# Patient Record
Sex: Female | Born: 1990 | Race: White | Marital: Married | Smoking: Never smoker
Health system: Southern US, Community
[De-identification: ages and names within clinical notes are randomized; demographics above are authoritative.]

## PROBLEM LIST (undated history)

## (undated) HISTORY — PX: TONSILLECTOMY: SUR1361

---

## 2018-01-17 ENCOUNTER — Ambulatory Visit (INDEPENDENT_AMBULATORY_CARE_PROVIDER_SITE_OTHER): Payer: PRIVATE HEALTH INSURANCE | Admitting: Obstetrics & Gynecology

## 2018-01-17 ENCOUNTER — Encounter: Payer: Self-pay | Admitting: Obstetrics & Gynecology

## 2018-01-17 VITALS — BP 119/70 | HR 84 | Ht 66.0 in | Wt 156.1 lb

## 2018-01-17 DIAGNOSIS — O021 Missed abortion: Secondary | ICD-10-CM | POA: Diagnosis not present

## 2018-01-17 DIAGNOSIS — Z3400 Encounter for supervision of normal first pregnancy, unspecified trimester: Secondary | ICD-10-CM | POA: Insufficient documentation

## 2018-01-17 MED ORDER — IBUPROFEN 800 MG PO TABS: 800.0000 mg | ORAL_TABLET | Freq: Three times a day (TID) | ORAL | 0 refills | Status: DC | PRN

## 2018-01-17 MED ORDER — MISOPROSTOL 200 MCG PO TABS: ORAL_TABLET | ORAL | 0 refills | Status: DC

## 2018-01-17 NOTE — Patient Instructions (Signed)

## 2018-01-17 NOTE — Progress Notes (Signed)
History:  27 y.o. G1P0 here today for NOB visit. Sure LMP places pt at 11 5/7 weeks.  She reports that 4 weeks prev her nausea and breast tenderness began to resolve.  Pt denies bleeding or pelvic pain.     The following portions of the patient's history were reviewed and updated as appropriate: allergies, current medications, past family history, past medical history, past social history, past surgical history and problem list.  Review of Systems:  Pertinent items are noted in HPI.    Objective:  Physical Exam Blood pressure 119/70, pulse 84, height 5\' 6"  (1.676 m), weight 156 lb 1.6 oz (70.8 kg), last menstrual period 10/28/2017.  CONSTITUTIONAL: Well-developed, well-nourished female in no acute distress.  HENT:  Normocephalic, atraumatic EYES: Conjunctivae and EOM are normal. No scleral icterus.  NECK: Normal range of motion SKIN: Skin is warm and dry. No rash noted. Not diaphoretic.No pallor. NEUROLGIC: Alert and oriented to person, place, and time. Normal coordination.  Abd: Soft, nontender and nondistended Pelvic: Normal appearing external genitalia.  Labs and Imaging TV US: 8 week IUP; no FHR  Assessment & Plan:  1. Missed abortion - misoprostol (CYTOTEC) 200 MCG tablet; Place four tablets intravaginally x 1. Repeat in 8-12 hours if no passage of tissue.  Dispense: 8 tablet; Refill: 0 - ibuprofen (ADVIL,MOTRIN) 800 MG tablet; Take 1 tablet (800 mg total) by mouth 3 (three) times daily with meals as needed for headache or moderate pain.  Dispense: 20 tablet; Refill: 0  Reviewed missed ab and tx options. Pt elects conservative management with cytotec. I have reviewed the procedure and the potential complications assoc with it and possible times of need for hosp f/u. All questions answered of pt, mother and her husband.      Total face-to-face time with patient was 25 min.  Greater than 50% was spent in counseling and coordination of care with the patient.   Modestine Scherzinger L.  Harraway-Smith, M.D., Evern CoreFACOG

## 2018-01-17 NOTE — Progress Notes (Signed)
DATING AND VIABILITY SONOGRAM   Liliane BadeKaela Zolman is a 27 y.o. year old G1P0 with LMP Patient's last menstrual period was 10/28/2017 (exact date). which would correlate to  369w4d weeks gestation.  She has regular menstrual cycles.   She is here today for a confirmatory initial sonogram.    GESTATION: SINGLETON  FETAL ACTIVITY:          Heart rate      none          The fetus is inactive.   ADNEXA: The ovaries are normal.   GESTATIONAL AGE AND  BIOMETRICS:  Gestational criteria: Estimated Date of Delivery: 08/04/18 by LMP now at 649w4d  Previous Scans:0  GESTATIONAL SAC           3.58 cm         8-5weeks  CROWN RUMP LENGTH           1.93 cm         8-3 weeks                                                                               AVERAGE EGA(BY THIS SCAN): 8-4weeks        TECHNICIAN COMMENTS:  Dr. Erin FullingHarraway Smith also performed bedside transvaginal ultrasound and did not see any fetal cardiac activity.    Armandina StammerJennifer Howard 01/17/2018 9:34 AM   Eber Jonesarolyn L. Harraway-Smith, M.D., Evern CoreFACOG

## 2018-01-31 ENCOUNTER — Encounter: Payer: Self-pay | Admitting: Family Medicine

## 2018-01-31 ENCOUNTER — Ambulatory Visit (INDEPENDENT_AMBULATORY_CARE_PROVIDER_SITE_OTHER): Payer: PRIVATE HEALTH INSURANCE | Admitting: Family Medicine

## 2018-01-31 VITALS — BP 127/85 | HR 98 | Ht 66.0 in | Wt 157.0 lb

## 2018-01-31 DIAGNOSIS — O021 Missed abortion: Secondary | ICD-10-CM | POA: Diagnosis not present

## 2018-01-31 NOTE — Progress Notes (Signed)
   Subjective:    Patient ID: Elizabeth Singleton, female    DOB: 01-28-1991, 27 y.o.   MRN: [id number]  HPI Seen for f/u of MAB. Had cytotec and passed tissue. No cramping and bleeding as of a 2-3 days ago.    Review of Systems     Objective:   Physical Exam  Constitutional: She is oriented to person, place, and time. She appears well-developed and well-nourished.  Cardiovascular: Normal rate and regular rhythm.  Pulmonary/Chest: Effort normal and breath sounds normal.  Abdominal: Soft. Bowel sounds are normal. She exhibits no mass. There is no tenderness. There is no rebound and no guarding.  Neurological: She is alert and oriented to person, place, and time.  Skin: Skin is warm and dry.  Psychiatric: She has a normal mood and affect. Her behavior is normal.      Assessment & Plan:  1. Missed abortion Continue PNV. Okay to resume activity. Discussed waiting 3 months prior to trying to get pregnant.

## 2019-07-14 NOTE — L&D Delivery Note (Signed)
Delivery Note At 4:24 PM a viable female was delivered via Vaginal, Spontaneous (Presentation:   Occiput Anterior).  APGAR: 7, 9; weight 6 lb 5.2 oz (2870 g).   Placenta status: Spontaneous, Intact.  Cord: 3 vessels with the following complications: loose nuchal x 1  Cord pH: NA  Anesthesia: Epidural Episiotomy: None Lacerations: None Suture Repair: NA Est. Blood Loss (mL): 50  Mom to postpartum.  Baby to NICU.  Lelon Mast 01/20/2020, 4:45 PM

## 2019-08-03 ENCOUNTER — Ambulatory Visit (INDEPENDENT_AMBULATORY_CARE_PROVIDER_SITE_OTHER): Payer: No Typology Code available for payment source | Admitting: Family Medicine

## 2019-08-03 ENCOUNTER — Encounter: Payer: Self-pay | Admitting: Family Medicine

## 2019-08-03 ENCOUNTER — Other Ambulatory Visit: Payer: Self-pay

## 2019-08-03 VITALS — BP 134/72 | HR 94 | Wt 170.0 lb

## 2019-08-03 DIAGNOSIS — Z3A09 9 weeks gestation of pregnancy: Secondary | ICD-10-CM

## 2019-08-03 DIAGNOSIS — Z362 Encounter for other antenatal screening follow-up: Secondary | ICD-10-CM | POA: Diagnosis not present

## 2019-08-03 DIAGNOSIS — Z1151 Encounter for screening for human papillomavirus (HPV): Secondary | ICD-10-CM

## 2019-08-03 DIAGNOSIS — Z124 Encounter for screening for malignant neoplasm of cervix: Secondary | ICD-10-CM | POA: Diagnosis not present

## 2019-08-03 DIAGNOSIS — Z113 Encounter for screening for infections with a predominantly sexual mode of transmission: Secondary | ICD-10-CM | POA: Diagnosis not present

## 2019-08-03 DIAGNOSIS — O0990 Supervision of high risk pregnancy, unspecified, unspecified trimester: Secondary | ICD-10-CM | POA: Diagnosis not present

## 2019-08-03 DIAGNOSIS — Z3401 Encounter for supervision of normal first pregnancy, first trimester: Secondary | ICD-10-CM | POA: Insufficient documentation

## 2019-08-03 DIAGNOSIS — Z3480 Encounter for supervision of other normal pregnancy, unspecified trimester: Secondary | ICD-10-CM

## 2019-08-03 DIAGNOSIS — Z3481 Encounter for supervision of other normal pregnancy, first trimester: Secondary | ICD-10-CM

## 2019-08-03 NOTE — Progress Notes (Signed)
Cf DATING AND VIABILITY SONOGRAM   Elizabeth Singleton is a 29 y.o. year old G2P0010 with LMP Patient's last menstrual period was 05/28/2019. which would correlate to  [redacted]w[redacted]d weeks gestation.  She has regular menstrual cycles.   She is here today for a confirmatory initial sonogram.    GESTATION: SINGLETON     FETAL ACTIVITY:          Heart rate     164 bpm          The fetus is active.    GESTATIONAL AGE AND  BIOMETRICS:  Gestational criteria: Estimated Date of Delivery: 03/03/20 by LMP now at [redacted]w[redacted]d  Previous Scans:0      CROWN RUMP LENGTH         3.37 cm         10-1 weeks                                                                               AVERAGE EGA(BY THIS SCAN): 10-1 weeks  WORKING EDD( LMP ):  03/03/2020     TECHNICIAN COMMENTS:  Patient informed that the ultrasound is considered a limited obstetric ultrasound and is not intended to be a complete ultrasound exam. Patient also informed that the ultrasound is not being completed with the intent of assessing for fetal or placental anomalies or any pelvic abnormalities. Explained that the purpose of today's ultrasound is to assess for fetal heart rate. Patient acknowledges the purpose of the exam and the limitations of the study.     Armandina Stammer 08/03/2019 3:57 PM

## 2019-08-03 NOTE — Progress Notes (Signed)
History:   Elizabeth Singleton is a 29 y.o. G2P0010 at [redacted]w[redacted]d by LMP consistent with 9wk [place], being seen today for her first obstetrical visit. FOB involved, patient's spouse. Planned and desired pregnancy. OB history significant for MAB. Patient does intend to breast feed. Pregnancy history fully reviewed.  Patient reports no complaints. She feels well and is excited for the pregnancy.      HISTORY: OB History  Gravida Para Term Preterm AB Living  2 0 0 0 1 0  SAB TAB Ectopic Multiple Live Births  1 0 0 0 0    # Outcome Date GA Lbr Len/2nd Weight Sex Delivery Anes PTL Lv  2 Current           1 SAB 01/2018             History reviewed. No pertinent past medical history. Past Surgical History:  Procedure Laterality Date  . TONSILLECTOMY     Family History  Problem Relation Age of Onset  . Hypertension Father   . Hypertension Paternal Grandmother   . Diabetes Neg Hx   . Cancer Neg Hx    Social History   Tobacco Use  . Smoking status: Never Smoker  . Smokeless tobacco: Never Used  Substance Use Topics  . Alcohol use: Never  . Drug use: Never   No Known Allergies Current Outpatient Medications on File Prior to Visit  Medication Sig Dispense Refill  . Prenatal Multivit-Min-Fe-FA (PRENATAL VITAMINS PO) Take by mouth.     No current facility-administered medications on file prior to visit.    Review of Systems Pertinent items noted in HPI and remainder of comprehensive ROS otherwise negative. Physical Exam:   Vitals:   08/03/19 1519 08/03/19 1630  BP: (!) 146/68 134/72  Pulse: 94   Weight: 170 lb (77.1 kg)    Fetal Heart Rate (bpm): 164 bpm Uterus:     Pelvic Exam: Perineum: no hemorrhoids, normal perineum   Vulva: normal external genitalia, no lesions   Vagina:  normal mucosa, normal discharge   Cervix: no lesions and normal, pap smear done.    Adnexa: normal adnexa and no mass, fullness, tenderness   Bony Pelvis: average  System: General: well-developed,  well-nourished female in no acute distress   Breasts:  normal appearance, no masses or tenderness bilaterally   Skin: normal coloration and turgor, no rashes   Neurologic: oriented, normal, negative, normal mood   Extremities: normal strength, tone, and muscle mass   HEENT Extraocular movement intact and sclera clear, anicteric   Mouth/Teeth mucous membranes moist   Neck supple and no masses   Cardiovascular: regular rate and rhythm   Respiratory:  no respiratory distress, normal breath sounds   Abdomen: soft, non-tender; bowel sounds normal; no masses,  no organomegaly     Assessment:    Pregnancy: G2P0010 Patient Active Problem List   Diagnosis Date Noted  . Encounter for supervision of normal first pregnancy in first trimester 08/03/2019     Plan:    1. Encounter for supervision of normal first pregnancy in first trimester Patient is feeling well without complaints. [place] reassuring and consistent with LMP. She requests genetic screening as FOB is 29 years old. Counseled on foods and activities to avoid in pregnancy. Planning for FOB vasectomy for [facility]. - Obstetric Panel, Including HIV - Urine Culture - Cytology - PAP( [place]) - Cystic Fibrosis Mutation 97  Initial labs drawn. Continue prenatal vitamins. Genetic Screening discussed, NIPS: requested. Ultrasound discussed; fetal anatomic  survey: requested. Problem list reviewed and updated. The nature of [facility] with multiple MDs and other Advanced Practice Providers was explained to patient; also emphasized that residents, students are part of our team. Routine obstetric precautions reviewed. No follow-ups on file.    Truett Mainland, DO 08/03/2019 5:20 PM

## 2019-08-06 LAB — URINE CULTURE

## 2019-08-08 LAB — OBSTETRIC PANEL, INCLUDING HIV
Antibody Screen: NEGATIVE
Basophils Absolute: 0.1 10*3/uL (ref 0.0–0.2)
Basos: 1 %
EOS (ABSOLUTE): 0.3 10*3/uL (ref 0.0–0.4)
Eos: 2 %
HIV Screen 4th Generation wRfx: NONREACTIVE
Hematocrit: 40.1 % (ref 34.0–46.6)
Hemoglobin: 13.4 g/dL (ref 11.1–15.9)
Hepatitis B Surface Ag: NEGATIVE
Immature Grans (Abs): 0 10*3/uL (ref 0.0–0.1)
Immature Granulocytes: 0 %
Lymphocytes Absolute: 2 10*3/uL (ref 0.7–3.1)
Lymphs: 15 %
MCH: 29.2 pg (ref 26.6–33.0)
MCHC: 33.4 g/dL (ref 31.5–35.7)
MCV: 87 fL (ref 79–97)
Monocytes Absolute: 0.9 10*3/uL (ref 0.1–0.9)
Monocytes: 6 %
Neutrophils Absolute: 10.2 10*3/uL — ABNORMAL HIGH (ref 1.4–7.0)
Neutrophils: 76 %
Platelets: 347 10*3/uL (ref 150–450)
RBC: 4.59 x10E6/uL (ref 3.77–5.28)
RDW: 12.7 % (ref 11.7–15.4)
RPR Ser Ql: NONREACTIVE
Rh Factor: NEGATIVE
Rubella Antibodies, IGG: 3.06 index (ref >0.99)
WBC: 13.4 10*3/uL — ABNORMAL HIGH (ref 3.4–10.8)

## 2019-08-08 LAB — CYSTIC FIBROSIS MUTATION 97: Interpretation: NOT DETECTED

## 2019-08-09 LAB — CYTOLOGY - PAP
Chlamydia: NEGATIVE
Comment: NEGATIVE
Comment: NEGATIVE
Comment: NORMAL
Diagnosis: NEGATIVE
High risk HPV: NEGATIVE
Neisseria Gonorrhea: NEGATIVE

## 2019-08-30 ENCOUNTER — Ambulatory Visit (INDEPENDENT_AMBULATORY_CARE_PROVIDER_SITE_OTHER): Payer: No Typology Code available for payment source | Admitting: Obstetrics & Gynecology

## 2019-08-30 ENCOUNTER — Other Ambulatory Visit: Payer: Self-pay

## 2019-08-30 VITALS — BP 123/72 | HR 87 | Wt 174.0 lb

## 2019-08-30 DIAGNOSIS — Z3A13 13 weeks gestation of pregnancy: Secondary | ICD-10-CM

## 2019-08-30 DIAGNOSIS — Z3401 Encounter for supervision of normal first pregnancy, first trimester: Secondary | ICD-10-CM

## 2019-08-30 DIAGNOSIS — Z3400 Encounter for supervision of normal first pregnancy, unspecified trimester: Secondary | ICD-10-CM

## 2019-08-30 NOTE — Progress Notes (Signed)
   PRENATAL VISIT NOTE  Subjective:  Elizabeth Singleton is a 29 y.o. G2P0010 at [redacted]w[redacted]d being seen today for ongoing prenatal care.  She is currently monitored for the following issues for this low-risk pregnancy and has Encounter for supervision of normal first pregnancy in first trimester on their problem list.  Patient reports pain bilaterally in lower abd. .  Contractions: Not present. Vag. Bleeding: None.  Movement: Absent. Denies leaking of fluid.   The following portions of the patient's history were reviewed and updated as appropriate: allergies, current medications, past family history, past medical history, past social history, past surgical history and problem list.   Objective:   Vitals:   08/30/19 1525  BP: 123/72  Pulse: 87  Weight: 174 lb (78.9 kg)    Fetal Status: Fetal Heart Rate (bpm): 154   Movement: Absent     General:  Alert, oriented and cooperative. Patient is in no acute distress.  Skin: Skin is warm and dry. No rash noted.   Cardiovascular: Normal heart rate noted  Respiratory: Normal respiratory effort, no problems with respiration noted  Abdomen: Soft, gravid, appropriate for gestational age.  Pain/Pressure: Present     Pelvic: Cervical exam deferred        Extremities: Normal range of motion.  Edema: None  Mental Status: Normal mood and affect. Normal behavior. Normal judgment and thought content.   Assessment and Plan:  Pregnancy: G2P0010 at [redacted]w[redacted]d 1. Supervision of normal first pregnancy, antepartum Round ligament pain  - Genetic Screening  2. Encounter for supervision of normal first pregnancy in first trimester  Preterm labor symptoms and general obstetric precautions including but not limited to vaginal bleeding, contractions, leaking of fluid and fetal movement were reviewed in detail with the patient. Please refer to After Visit Summary for other counseling recommendations.   No follow-ups on file.  No future appointments.  Willodean Rosenthal, MD

## 2019-09-13 ENCOUNTER — Other Ambulatory Visit: Payer: Self-pay

## 2019-09-13 DIAGNOSIS — Z3400 Encounter for supervision of normal first pregnancy, unspecified trimester: Secondary | ICD-10-CM

## 2019-09-13 NOTE — Progress Notes (Signed)
error 

## 2019-09-14 ENCOUNTER — Ambulatory Visit: Payer: No Typology Code available for payment source

## 2019-09-14 ENCOUNTER — Other Ambulatory Visit: Payer: Self-pay

## 2019-09-14 VITALS — Wt 170.0 lb

## 2019-09-14 DIAGNOSIS — Z3400 Encounter for supervision of normal first pregnancy, unspecified trimester: Secondary | ICD-10-CM

## 2019-09-14 NOTE — Progress Notes (Signed)
Patient having Panorama redrawn due to lost sample to panorama. Patient had AFP drawn in addition today. Armandina Stammer RN

## 2019-09-16 LAB — AFP TETRA
DIA Mom Value: 1.13
DIA Value (EIA): 178.59 pg/mL
DSR (By Age)    1 IN: 648
DSR (Second Trimester) 1 IN: 1214
Gestational Age: 15.6 WEEKS
MSAFP Mom: 0.91
MSAFP: 26.4 ng/mL
MSHCG Mom: 1.2
MSHCG: 51081 m[IU]/mL
Maternal Age At EDD: 30.6 yr
Osb Risk: 10000
T18 (By Age): 1:2523 {titer}
Test Results:: NEGATIVE
Weight: 170 [lb_av]
uE3 Mom: 0.75
uE3 Value: 0.64 ng/mL

## 2019-09-28 ENCOUNTER — Ambulatory Visit (INDEPENDENT_AMBULATORY_CARE_PROVIDER_SITE_OTHER): Payer: No Typology Code available for payment source | Admitting: Family Medicine

## 2019-09-28 ENCOUNTER — Other Ambulatory Visit: Payer: Self-pay

## 2019-09-28 ENCOUNTER — Encounter: Payer: Self-pay | Admitting: Family Medicine

## 2019-09-28 VITALS — BP 116/58 | HR 78 | Wt 177.0 lb

## 2019-09-28 DIAGNOSIS — O99891 Other specified diseases and conditions complicating pregnancy: Secondary | ICD-10-CM | POA: Diagnosis not present

## 2019-09-28 DIAGNOSIS — Z3A17 17 weeks gestation of pregnancy: Secondary | ICD-10-CM

## 2019-09-28 DIAGNOSIS — Z3400 Encounter for supervision of normal first pregnancy, unspecified trimester: Secondary | ICD-10-CM

## 2019-09-28 DIAGNOSIS — M9905 Segmental and somatic dysfunction of pelvic region: Secondary | ICD-10-CM

## 2019-09-28 DIAGNOSIS — M9904 Segmental and somatic dysfunction of sacral region: Secondary | ICD-10-CM

## 2019-09-28 DIAGNOSIS — Z3401 Encounter for supervision of normal first pregnancy, first trimester: Secondary | ICD-10-CM

## 2019-09-28 DIAGNOSIS — M549 Dorsalgia, unspecified: Secondary | ICD-10-CM

## 2019-09-28 DIAGNOSIS — M9903 Segmental and somatic dysfunction of lumbar region: Secondary | ICD-10-CM

## 2019-09-28 NOTE — Progress Notes (Signed)
   PRENATAL VISIT NOTE  Subjective:  Elizabeth Singleton is a 29 y.o. G2P0010 at [redacted]w[redacted]d being seen today for ongoing prenatal care.  She is currently monitored for the following issues for this low-risk pregnancy and has Encounter for supervision of normal first pregnancy in first trimester on their problem list.  Patient reports backache.  Contractions: Not present. Vag. Bleeding: None.   . Denies leaking of fluid.   The following portions of the patient's history were reviewed and updated as appropriate: allergies, current medications, past family history, past medical history, past social history, past surgical history and problem list. Problem list updated.  Objective:   Vitals:   09/28/19 1409  BP: (!) 116/58  Pulse: 78  Weight: 177 lb (80.3 kg)    Fetal Status:           General:  Alert, oriented and cooperative. Patient is in no acute distress.  Skin: Skin is warm and dry. No rash noted.   Cardiovascular: Normal heart rate noted  Respiratory: Normal respiratory effort, no problems with respiration noted  Abdomen: Soft, gravid, appropriate for gestational age. Pain/Pressure: Absent     Pelvic:  Cervical exam deferred        MSK: Restriction, tenderness, tissue texture changes, and paraspinal spasm in the left spine  Neuro: Moves all four extremities with no focal neurological deficit  Extremities: Normal range of motion.  Edema: None  Mental Status: Normal mood and affect. Normal behavior. Normal judgment and thought content.   OSE: Head   Cervical   Thoracic   Rib   Lumbar E5 ESRL  Sacrum L/L  Pelvis Right ant.    Assessment and Plan:  Pregnancy: G2P0010 at [redacted]w[redacted]d  1. Supervision of normal first pregnancy, antepartum FHT and FH normal  2. Back pain affecting pregnancy in second trimester 3. Somatic dysfunction of lumbar region 4. Somatic dysfunction of sacral region 5. Somatic dysfunction of pelvis region OMT done after patient permission. HVLA technique utilized. 3  areas treated with improvement of tissue texture and joint mobility. Patient tolerated procedure well.   Preterm labor symptoms and general obstetric precautions including but not limited to vaginal bleeding, contractions, leaking of fluid and fetal movement were reviewed in detail with the patient. Please refer to After Visit Summary for other counseling recommendations.  No follow-ups on file.  Levie Heritage, DO

## 2019-10-09 ENCOUNTER — Ambulatory Visit (HOSPITAL_COMMUNITY)
Admission: RE | Admit: 2019-10-09 | Discharge: 2019-10-09 | Disposition: A | Discharge status: Home / Self Care | Payer: Medicaid Other | Source: Ambulatory Visit | Attending: Family Medicine | Admitting: Family Medicine

## 2019-10-09 ENCOUNTER — Other Ambulatory Visit: Payer: Self-pay | Admitting: Family Medicine

## 2019-10-09 ENCOUNTER — Other Ambulatory Visit (HOSPITAL_COMMUNITY): Payer: Self-pay | Admitting: *Deleted

## 2019-10-09 ENCOUNTER — Other Ambulatory Visit: Payer: Self-pay

## 2019-10-09 DIAGNOSIS — Z362 Encounter for other antenatal screening follow-up: Secondary | ICD-10-CM

## 2019-10-09 DIAGNOSIS — Z3400 Encounter for supervision of normal first pregnancy, unspecified trimester: Secondary | ICD-10-CM

## 2019-10-09 DIAGNOSIS — O359XX0 Maternal care for (suspected) fetal abnormality and damage, unspecified, not applicable or unspecified: Secondary | ICD-10-CM

## 2019-10-09 DIAGNOSIS — Z3A19 19 weeks gestation of pregnancy: Secondary | ICD-10-CM | POA: Diagnosis not present

## 2019-10-09 DIAGNOSIS — Z363 Encounter for antenatal screening for malformations: Secondary | ICD-10-CM | POA: Diagnosis not present

## 2019-11-02 ENCOUNTER — Other Ambulatory Visit: Payer: Self-pay

## 2019-11-02 ENCOUNTER — Encounter: Payer: Self-pay | Admitting: Family Medicine

## 2019-11-02 ENCOUNTER — Ambulatory Visit (INDEPENDENT_AMBULATORY_CARE_PROVIDER_SITE_OTHER): Payer: No Typology Code available for payment source | Admitting: Family Medicine

## 2019-11-02 VITALS — BP 127/61 | HR 66 | Wt 181.0 lb

## 2019-11-02 DIAGNOSIS — Z3402 Encounter for supervision of normal first pregnancy, second trimester: Secondary | ICD-10-CM

## 2019-11-02 DIAGNOSIS — Z3A24 24 weeks gestation of pregnancy: Secondary | ICD-10-CM

## 2019-11-02 DIAGNOSIS — Z3400 Encounter for supervision of normal first pregnancy, unspecified trimester: Secondary | ICD-10-CM

## 2019-11-02 NOTE — Progress Notes (Signed)
   PRENATAL VISIT NOTE  Subjective:  Elizabeth Singleton is a 29 y.o. G2P0010 at [redacted]w[redacted]d being seen today for ongoing prenatal care.  She is currently monitored for the following issues for this low-risk pregnancy and has Encounter for supervision of normal first pregnancy in first trimester on their problem list.  Patient reports no complaints.  Contractions: Not present. Vag. Bleeding: None.  Movement: Present. Denies leaking of fluid.   The following portions of the patient's history were reviewed and updated as appropriate: allergies, current medications, past family history, past medical history, past social history, past surgical history and problem list.   Objective:   Vitals:   11/02/19 1039  BP: 127/61  Pulse: 66  Weight: 181 lb (82.1 kg)    Fetal Status: Fetal Heart Rate (bpm): 150   Movement: Present     General:  Alert, oriented and cooperative. Patient is in no acute distress.  Skin: Skin is warm and dry. No rash noted.   Cardiovascular: Normal heart rate noted  Respiratory: Normal respiratory effort, no problems with respiration noted  Abdomen: Soft, gravid, appropriate for gestational age.  Pain/Pressure: Absent     Pelvic: Cervical exam deferred        Extremities: Normal range of motion.  Edema: None  Mental Status: Normal mood and affect. Normal behavior. Normal judgment and thought content.   Assessment and Plan:  Pregnancy: G2P0010 at [redacted]w[redacted]d 1. Supervision of normal first pregnancy, antepartum Doing well. Has EF LV. Rpt [place] on Monday. Low DSR with panorama.  Preterm labor symptoms and general obstetric precautions including but not limited to vaginal bleeding, contractions, leaking of fluid and fetal movement were reviewed in detail with the patient. Please refer to After Visit Summary for other counseling recommendations.   Return in about 4 weeks (around 11/30/2019) for OB f/u, 2 hr GTT, In Office.  Future Appointments  Date Time Provider Department Center    11/06/2019 11:15 AM [facility] [place] 4 [facility]  11/30/2019  9:15 AM Levie Heritage, DO [facility] None    Levie Heritage, DO

## 2019-11-02 NOTE — Progress Notes (Signed)
   PRENATAL VISIT NOTE  Subjective:  Elizabeth Singleton is a 29 y.o. G2P0010 at [redacted]w[redacted]d being seen today for ongoing prenatal care.  She is currently monitored for the following issues for this low-risk pregnancy and has Encounter for supervision of normal first pregnancy in first trimester on their problem list.  Patient reports no complaints.  Contractions: Not present. Vag. Bleeding: None.  Movement: Present. Denies leaking of fluid.   The following portions of the patient's history were reviewed and updated as appropriate: allergies, current medications, past family history, past medical history, past social history, past surgical history and problem list.   Objective:   Vitals:   11/02/19 1039  BP: 127/61  Pulse: 66  Weight: 181 lb (82.1 kg)    Fetal Status: Fetal Heart Rate (bpm): 150   Movement: Present     General:  Alert, oriented and cooperative. Patient is in no acute distress.  Skin: Skin is warm and dry. No rash noted.   Cardiovascular: Normal heart rate noted  Respiratory: Normal respiratory effort, no problems with respiration noted  Abdomen: Soft, gravid, appropriate for gestational age.  Pain/Pressure: Absent     Pelvic: Cervical exam deferred        Extremities: Normal range of motion.  Edema: None  Mental Status: Normal mood and affect. Normal behavior. Normal judgment and thought content.   Assessment and Plan:  Pregnancy: G2P0010 at [redacted]w[redacted]d  1. Supervision of normal first pregnancy, antepartum FHT and FH normal  Preterm labor symptoms and general obstetric precautions including but not limited to vaginal bleeding, contractions, leaking of fluid and fetal movement were reviewed in detail with the patient. Please refer to After Visit Summary for other counseling recommendations.   Return in about 4 weeks (around 11/30/2019) for OB f/u, 2 hr GTT, In Office.  Future Appointments  Date Time Provider Department Center  11/06/2019 11:15 AM [facility] [place] 4 [facility]    11/30/2019  9:15 AM Levie Heritage, DO [facility] None    Laural Benes, MS3

## 2019-11-06 ENCOUNTER — Other Ambulatory Visit: Payer: Self-pay

## 2019-11-06 ENCOUNTER — Ambulatory Visit (HOSPITAL_COMMUNITY)
Admission: RE | Admit: 2019-11-06 | Discharge: 2019-11-06 | Disposition: A | Discharge status: Home / Self Care | Payer: No Typology Code available for payment source | Source: Ambulatory Visit | Attending: Obstetrics and Gynecology | Admitting: Obstetrics and Gynecology

## 2019-11-06 DIAGNOSIS — O359XX0 Maternal care for (suspected) fetal abnormality and damage, unspecified, not applicable or unspecified: Secondary | ICD-10-CM | POA: Diagnosis not present

## 2019-11-06 DIAGNOSIS — Z3A23 23 weeks gestation of pregnancy: Secondary | ICD-10-CM | POA: Diagnosis not present

## 2019-11-06 DIAGNOSIS — Z362 Encounter for other antenatal screening follow-up: Secondary | ICD-10-CM | POA: Diagnosis not present

## 2019-11-30 ENCOUNTER — Other Ambulatory Visit: Payer: Self-pay

## 2019-11-30 ENCOUNTER — Ambulatory Visit (HOSPITAL_BASED_OUTPATIENT_CLINIC_OR_DEPARTMENT_OTHER): Payer: No Typology Code available for payment source | Admitting: Family Medicine

## 2019-11-30 VITALS — BP 109/67 | HR 85 | Wt 188.0 lb

## 2019-11-30 DIAGNOSIS — Z3A26 26 weeks gestation of pregnancy: Secondary | ICD-10-CM

## 2019-11-30 DIAGNOSIS — Z23 Encounter for immunization: Secondary | ICD-10-CM

## 2019-11-30 DIAGNOSIS — Z6791 Unspecified blood type, Rh negative: Secondary | ICD-10-CM

## 2019-11-30 DIAGNOSIS — O26892 Other specified pregnancy related conditions, second trimester: Secondary | ICD-10-CM | POA: Diagnosis not present

## 2019-11-30 DIAGNOSIS — Z3401 Encounter for supervision of normal first pregnancy, first trimester: Secondary | ICD-10-CM

## 2019-11-30 MED ORDER — RHO D IMMUNE GLOBULIN 1500 UNIT/2ML IJ SOSY
300.0000 ug | PREFILLED_SYRINGE | Freq: Once | INTRAMUSCULAR | Status: AC
  Administered 2019-11-30: 300 ug via INTRAMUSCULAR

## 2019-11-30 NOTE — Progress Notes (Signed)
   PRENATAL VISIT NOTE  Subjective:  Elizabeth Singleton is a 29 y.o. G2P0010 at [redacted]w[redacted]d being seen today for ongoing prenatal care.  She is currently monitored for the following issues for this low-risk pregnancy and has Encounter for supervision of normal first pregnancy in first trimester on their problem list.  Patient reports headaches (1x/week, same as pre-pregnancy), heartburn, and pubic soreness. States pubic symphysis soreness has been periodic for 2-3 weeks. Contractions: Not present. Vag. Bleeding: None.  Movement: (!) Decreased. Reports decreased overall fetal movement and less robust kicking over past two days; denies any changes in diet, activity, etc.  Denies leaking of fluid.   The following portions of the patient's history were reviewed and updated as appropriate: allergies, current medications, past family history, past medical history, past social history, past surgical history and problem list.   Objective:   Vitals:   11/30/19 0922  BP: 109/67  Pulse: 85  Weight: 188 lb (85.3 kg)    Fetal Status: Fetal Heart Rate (bpm): 134   Movement: (!) Decreased     General:  Alert, oriented and cooperative. Patient is in no acute distress.  Skin: Skin is warm and dry. No rash noted.   Cardiovascular: Normal heart rate noted  Respiratory: Normal respiratory effort, no problems with respiration noted  Abdomen: Soft, gravid, appropriate for gestational age.  Pain/Pressure: Present     Pelvic: Cervical exam deferred        Extremities: Normal range of motion.  Edema: None  Mental Status: Normal mood and affect. Normal behavior. Normal judgment and thought content.   Assessment and Plan:  Pregnancy: G2P0010 at [redacted]w[redacted]d  1. Encounter for supervision of normal first pregnancy in first trimester - RPR - CBC - HIV Antibody (routine testing w rflx) - Tdap vaccine greater than or equal to 7yo IM - Glucose Tolerance, 2 Hours w/1 Hour - rho (d) immune globulin (RHIG/RHOPHYLAC) injection 300  mcg  2. Rh negative state in antepartum period - RPR - CBC - HIV Antibody (routine testing w rflx) - Tdap vaccine greater than or equal to 7yo IM - Glucose Tolerance, 2 Hours w/1 Hour - rho (d) immune globulin (RHIG/RHOPHYLAC) injection 300 mcg    Preterm labor symptoms and general obstetric precautions including but not limited to vaginal bleeding, contractions, leaking of fluid and fetal movement were reviewed in detail with the patient. Please refer to After Visit Summary for other counseling recommendations.   No follow-ups on file.  No future appointments.  Bayard Beaver, Medical Student

## 2019-12-01 LAB — CBC
Hematocrit: 34.3 % (ref 34.0–46.6)
Hemoglobin: 11.4 g/dL (ref 11.1–15.9)
MCH: 29.2 pg (ref 26.6–33.0)
MCHC: 33.2 g/dL (ref 31.5–35.7)
MCV: 88 fL (ref 79–97)
Platelets: 341 10*3/uL (ref 150–450)
RBC: 3.91 x10E6/uL (ref 3.77–5.28)
RDW: 13 % (ref 11.7–15.4)
WBC: 12.1 10*3/uL — ABNORMAL HIGH (ref 3.4–10.8)

## 2019-12-01 LAB — GLUCOSE TOLERANCE, 2 HOURS W/ 1HR
Glucose, 1 hour: 147 mg/dL (ref 65–179)
Glucose, 2 hour: 146 mg/dL (ref 65–152)
Glucose, Fasting: 77 mg/dL (ref 65–91)

## 2019-12-01 LAB — HIV ANTIBODY (ROUTINE TESTING W REFLEX): HIV Screen 4th Generation wRfx: NONREACTIVE

## 2019-12-01 LAB — RPR: RPR Ser Ql: NONREACTIVE

## 2019-12-28 ENCOUNTER — Ambulatory Visit (INDEPENDENT_AMBULATORY_CARE_PROVIDER_SITE_OTHER): Payer: No Typology Code available for payment source | Admitting: Family Medicine

## 2019-12-28 ENCOUNTER — Other Ambulatory Visit: Payer: Self-pay

## 2019-12-28 VITALS — BP 109/68 | HR 89 | Wt 197.1 lb

## 2019-12-28 DIAGNOSIS — Z3401 Encounter for supervision of normal first pregnancy, first trimester: Secondary | ICD-10-CM

## 2019-12-28 DIAGNOSIS — Z3483 Encounter for supervision of other normal pregnancy, third trimester: Secondary | ICD-10-CM

## 2019-12-28 DIAGNOSIS — Z6791 Unspecified blood type, Rh negative: Secondary | ICD-10-CM

## 2019-12-28 DIAGNOSIS — Z3A30 30 weeks gestation of pregnancy: Secondary | ICD-10-CM

## 2019-12-28 DIAGNOSIS — O26899 Other specified pregnancy related conditions, unspecified trimester: Secondary | ICD-10-CM

## 2019-12-28 NOTE — Progress Notes (Signed)
   PRENATAL VISIT NOTE  Subjective:  Elizabeth Singleton is a 29 y.o. G2P0010 at [redacted]w[redacted]d being seen today for ongoing prenatal care.  She is currently monitored for the following issues for this low-risk pregnancy and has Encounter for supervision of normal first pregnancy in first trimester and Rh negative state in antepartum period on their problem list.  Patient reports no complaints.  Contractions: Not present. Vag. Bleeding: None.  Movement: Present. Denies leaking of fluid.   The following portions of the patient's history were reviewed and updated as appropriate: allergies, current medications, past family history, past medical history, past social history, past surgical history and problem list.   Objective:   Vitals:   12/28/19 1005  BP: 109/68  Pulse: 89  Weight: 197 lb 1.3 oz (89.4 kg)    Fetal Status: Fetal Heart Rate (bpm): 143   Movement: Present     General:  Alert, oriented and cooperative. Patient is in no acute distress.  Skin: Skin is warm and dry. No rash noted.   Cardiovascular: Normal heart rate noted  Respiratory: Normal respiratory effort, no problems with respiration noted  Abdomen: Soft, gravid, appropriate for gestational age.  Pain/Pressure: Present     Pelvic: Cervical exam deferred        Extremities: Normal range of motion.  Edema: Trace  Mental Status: Normal mood and affect. Normal behavior. Normal judgment and thought content.   Assessment and Plan:  Pregnancy: G2P0010 at [redacted]w[redacted]d 1. Encounter for supervision of normal first pregnancy in first trimester FHT and FH normal  2. Rh negative state in antepartum period Rhogam given at 28 weeks  Preterm labor symptoms and general obstetric precautions including but not limited to vaginal bleeding, contractions, leaking of fluid and fetal movement were reviewed in detail with the patient. Please refer to After Visit Summary for other counseling recommendations.   Return in about 2 weeks (around 01/11/2020) for OB  f/u.  Future Appointments  Date Time Provider Department Center  01/11/2020  1:15 PM Levie Heritage, DO [facility] None    Levie Heritage, DO

## 2020-01-11 ENCOUNTER — Other Ambulatory Visit: Payer: Self-pay

## 2020-01-11 ENCOUNTER — Ambulatory Visit (INDEPENDENT_AMBULATORY_CARE_PROVIDER_SITE_OTHER): Payer: No Typology Code available for payment source | Admitting: Family Medicine

## 2020-01-11 VITALS — BP 117/80 | HR 115 | Wt 202.1 lb

## 2020-01-11 DIAGNOSIS — O26893 Other specified pregnancy related conditions, third trimester: Secondary | ICD-10-CM

## 2020-01-11 DIAGNOSIS — Z3A32 32 weeks gestation of pregnancy: Secondary | ICD-10-CM

## 2020-01-11 DIAGNOSIS — Z419 Encounter for procedure for purposes other than remedying health state, unspecified: Secondary | ICD-10-CM | POA: Diagnosis not present

## 2020-01-11 DIAGNOSIS — Z6791 Unspecified blood type, Rh negative: Secondary | ICD-10-CM

## 2020-01-11 DIAGNOSIS — R12 Heartburn: Secondary | ICD-10-CM

## 2020-01-11 DIAGNOSIS — Z3401 Encounter for supervision of normal first pregnancy, first trimester: Secondary | ICD-10-CM

## 2020-01-11 MED ORDER — FAMOTIDINE 20 MG PO TABS: 20.0000 mg | ORAL_TABLET | Freq: Two times a day (BID) | ORAL | 3 refills | Status: DC

## 2020-01-11 NOTE — Progress Notes (Signed)
   PRENATAL VISIT NOTE  Subjective:  Elizabeth Singleton is a 29 y.o. G2P0010 at [redacted]w[redacted]d being seen today for ongoing prenatal care.  She is currently monitored for the following issues for this low-risk pregnancy and has Encounter for supervision of normal first pregnancy in first trimester and Rh negative state in antepartum period on their problem list.  Patient reports heartburn.  Contractions: Not present. Vag. Bleeding: None.  Movement: Present. Denies leaking of fluid.   The following portions of the patient's history were reviewed and updated as appropriate: allergies, current medications, past family history, past medical history, past social history, past surgical history and problem list.   Objective:   Vitals:   01/11/20 1306  BP: 117/80  Pulse: (!) 115  Weight: 202 lb 1.3 oz (91.7 kg)    Fetal Status: Fetal Heart Rate (bpm): 134   Movement: Present     General:  Alert, oriented and cooperative. Patient is in no acute distress.  Skin: Skin is warm and dry. No rash noted.   Cardiovascular: Normal heart rate noted  Respiratory: Normal respiratory effort, no problems with respiration noted  Abdomen: Soft, gravid, appropriate for gestational age.  Pain/Pressure: Present     Pelvic: Cervical exam deferred        Extremities: Normal range of motion.  Edema: None  Mental Status: Normal mood and affect. Normal behavior. Normal judgment and thought content.   Assessment and Plan:  Pregnancy: G2P0010 at [redacted]w[redacted]d  1. Encounter for supervision of normal first pregnancy in first trimester FHT and FH normal  2. Rh negative state in antepartum period Rhogam given at 28 weeks  3. Heartburn during pregnancy in third trimester pepcid    Preterm labor symptoms and general obstetric precautions including but not limited to vaginal bleeding, contractions, leaking of fluid and fetal movement were reviewed in detail with the patient. Please refer to After Visit Summary for other counseling  recommendations.   No follow-ups on file.  Future Appointments  Date Time Provider Department Center  01/30/2020 10:50 AM Aviva Signs, CNM [facility] None    Levie Heritage, DO

## 2020-01-19 ENCOUNTER — Other Ambulatory Visit: Payer: Self-pay

## 2020-01-19 ENCOUNTER — Inpatient Hospital Stay (HOSPITAL_COMMUNITY)
Admission: AD | Admit: 2020-01-19 | Discharge: 2020-01-22 | DRG: 807 | Disposition: A | Discharge status: Home / Self Care | Payer: Medicaid Other | Attending: Obstetrics and Gynecology | Admitting: Obstetrics and Gynecology

## 2020-01-19 ENCOUNTER — Encounter (HOSPITAL_COMMUNITY): Payer: Self-pay | Admitting: Family Medicine

## 2020-01-19 DIAGNOSIS — Z3A33 33 weeks gestation of pregnancy: Secondary | ICD-10-CM

## 2020-01-19 DIAGNOSIS — O42919 Preterm premature rupture of membranes, unspecified as to length of time between rupture and onset of labor, unspecified trimester: Secondary | ICD-10-CM | POA: Diagnosis present

## 2020-01-19 DIAGNOSIS — Z20822 Contact with and (suspected) exposure to covid-19: Secondary | ICD-10-CM | POA: Diagnosis not present

## 2020-01-19 DIAGNOSIS — O26893 Other specified pregnancy related conditions, third trimester: Secondary | ICD-10-CM | POA: Diagnosis not present

## 2020-01-19 DIAGNOSIS — O42913 Preterm premature rupture of membranes, unspecified as to length of time between rupture and onset of labor, third trimester: Secondary | ICD-10-CM | POA: Diagnosis not present

## 2020-01-19 DIAGNOSIS — Z6791 Unspecified blood type, Rh negative: Secondary | ICD-10-CM

## 2020-01-19 DIAGNOSIS — O42013 Preterm premature rupture of membranes, onset of labor within 24 hours of rupture, third trimester: Secondary | ICD-10-CM | POA: Diagnosis not present

## 2020-01-19 LAB — URINALYSIS, ROUTINE W REFLEX MICROSCOPIC
Bilirubin Urine: NEGATIVE
Glucose, UA: NEGATIVE mg/dL
Ketones, ur: NEGATIVE mg/dL
Leukocytes,Ua: NEGATIVE
Nitrite: NEGATIVE
Protein, ur: 100 mg/dL — AB
Specific Gravity, Urine: 1.003 — ABNORMAL LOW (ref 1.005–1.030)
pH: 7 (ref 5.0–8.0)

## 2020-01-19 LAB — POCT FERN TEST: POCT Fern Test: POSITIVE

## 2020-01-19 MED ORDER — AZITHROMYCIN 250 MG PO TABS
1000.0000 mg | ORAL_TABLET | Freq: Once | ORAL | Status: AC
  Administered 2020-01-20: 1000 mg via ORAL
  Filled 2020-01-19: qty 4

## 2020-01-19 MED ORDER — CALCIUM CARBONATE ANTACID 500 MG PO CHEW: 2.0000 | CHEWABLE_TABLET | ORAL | Status: DC | PRN

## 2020-01-19 MED ORDER — AMOXICILLIN 500 MG PO CAPS: 500.0000 mg | ORAL_CAPSULE | Freq: Three times a day (TID) | ORAL | Status: DC

## 2020-01-19 MED ORDER — DOCUSATE SODIUM 100 MG PO CAPS: 100.0000 mg | ORAL_CAPSULE | Freq: Every day | ORAL | Status: DC

## 2020-01-19 MED ORDER — SODIUM CHLORIDE 0.9 % IV SOLN
2.0000 g | Freq: Four times a day (QID) | INTRAVENOUS | Status: DC
  Administered 2020-01-20 (×2): 2 g via INTRAVENOUS
  Filled 2020-01-19 (×2): qty 2000

## 2020-01-19 MED ORDER — LACTATED RINGERS IV SOLN: INTRAVENOUS | Status: DC

## 2020-01-19 MED ORDER — ZOLPIDEM TARTRATE 5 MG PO TABS: 5.0000 mg | ORAL_TABLET | Freq: Every evening | ORAL | Status: DC | PRN

## 2020-01-19 MED ORDER — BETAMETHASONE SOD PHOS & ACET 6 (3-3) MG/ML IJ SUSP
12.0000 mg | INTRAMUSCULAR | Status: DC
  Administered 2020-01-20: 12 mg via INTRAMUSCULAR
  Filled 2020-01-19: qty 5

## 2020-01-19 MED ORDER — ACETAMINOPHEN 325 MG PO TABS
650.0000 mg | ORAL_TABLET | ORAL | Status: DC | PRN
  Administered 2020-01-20: 650 mg via ORAL
  Filled 2020-01-19: qty 2

## 2020-01-19 MED ORDER — PRENATAL MULTIVITAMIN CH: 1.0000 | ORAL_TABLET | Freq: Every day | ORAL | Status: DC

## 2020-01-19 NOTE — MAU Note (Addendum)
..  Elizabeth Singleton is a 29 y.o. at [redacted]w[redacted]d here in MAU reporting: SROM at 2200. Clear fluid. Pt states she is having some mild abdominal cramping.  +FM  Pain score: 4/10 cramping Vitals:   01/19/20 2252  BP: 138/86  Pulse: (!) 102  Resp: 16  Temp: 99 F (37.2 C)      Lab orders placed from triage: Crist Fat

## 2020-01-19 NOTE — H&P (Signed)
FACULTY PRACTICE ANTEPARTUM ADMISSION HISTORY AND PHYSICAL NOTE   History of Present Illness: Elizabeth Singleton is a 29 y.o. G2P0010 at [redacted]w[redacted]d admitted for PPROM at 2200 on 01/19/2020.   Patient reports the fetal movement as active. Patient reports uterine contraction  activity as none. Patient reports  vaginal bleeding as none. Patient describes fluid per vagina as Clear. Fetal presentation is cephalic, confirmed by bedside US   Patient Active Problem List   Diagnosis Date Noted  . Preterm premature rupture of membranes (PPROM) with unknown onset of labor 01/19/2020  . Rh negative state in antepartum period 12/28/2019  . Encounter for supervision of normal first pregnancy in first trimester 08/03/2019    History reviewed. No pertinent past medical history.  Past Surgical History:  Procedure Laterality Date  . TONSILLECTOMY      OB History  Gravida Para Term Preterm AB Living  2       1    SAB TAB Ectopic Multiple Live Births  1            # Outcome Date GA Lbr Len/2nd Weight Sex Delivery Anes PTL Lv  2 Current           1 SAB 01/2018            Social History   Socioeconomic History  . Marital status: Married    Spouse name: Not on file  . Number of children: Not on file  . Years of education: Not on file  . Highest education level: Not on file  Occupational History  . Not on file  Tobacco Use  . Smoking status: Never Smoker  . Smokeless tobacco: Never Used  Vaping Use  . Vaping Use: Never used  Substance and Sexual Activity  . Alcohol use: Never  . Drug use: Never  . Sexual activity: Yes    Birth control/protection: None  Other Topics Concern  . Not on file  Social History Narrative  . Not on file   Social Determinants of Health   Financial Resource Strain:   . Difficulty of Paying Living Expenses:   Food Insecurity:   . Worried About Programme researcher, broadcasting/film/video in the Last Year:   . Barista in the Last Year:   Transportation Needs:   . Automotive engineer (Medical):   Marland Kitchen Lack of Transportation (Non-Medical):   Physical Activity:   . Days of Exercise per Week:   . Minutes of Exercise per Session:   Stress:   . Feeling of Stress :   Social Connections:   . Frequency of Communication with Friends and Family:   . Frequency of Social Gatherings with Friends and Family:   . Attends Religious Services:   . Active Member of Clubs or Organizations:   . Attends Banker Meetings:   Marland Kitchen Marital Status:     Family History  Problem Relation Age of Onset  . Hypertension Father   . Hypertension Paternal Grandmother   . Diabetes Neg Hx   . Cancer Neg Hx     No Known Allergies  Medications Prior to Admission  Medication Sig Dispense Refill Last Dose  . famotidine (PEPCID) 20 MG tablet Take 1 tablet (20 mg total) by mouth 2 (two) times daily. 60 tablet 3 01/19/2020 at Unknown time  . Prenatal Multivit-Min-Fe-FA (PRENATAL VITAMINS PO) Take by mouth.   01/19/2020 at Unknown time    Review of Systems - Negative except leaking of fluid  Vitals:  BP  138/86 (BP Location: Right Arm)   Pulse (!) 102   Temp 99 F (37.2 C) (Oral)   Resp 16   LMP 05/28/2019  Physical Examination: CONSTITUTIONAL: Well-developed, well-nourished female in no acute distress.  HENT:  Normocephalic, atraumatic, External right and left ear normal. Oropharynx is clear and moist EYES: Conjunctivae and EOM are normal. Pupils are equal, round, and reactive to light.  NECK: Normal range of motion, supple, no masses SKIN: Skin is warm and dry. No rash noted. Not diaphoretic. No erythema. No pallor. NEUROLGIC: Alert and oriented to person, place, and time. Normal reflexes, muscle tone coordination. No cranial nerve deficit noted. PSYCHIATRIC: Normal mood and affect. Normal behavior. Normal judgment and thought content. CARDIOVASCULAR: Normal heart rate noted, regular rhythm RESPIRATORY: Effort and breath sounds normal, no problems with respiration  noted ABDOMEN: Soft, nontender, nondistended, gravid appropriate for gestational age. MUSCULOSKELETAL: Normal range of motion. No edema and no tenderness. 2+ distal pulses.  Pt informed that the ultrasound is considered a limited OB ultrasound and is not intended to be a complete ultrasound exam.  Patient also informed that the ultrasound is not being completed with the intent of assessing for fetal or placental anomalies or any pelvic abnormalities.  Explained that the purpose of today's ultrasound is to assess for  presentation.  Patient acknowledges the purpose of the exam and the limitations of the study.    Cephalic presentation by ultrasound.   Cervix: Not evaluated. Patient denies abdominal pain or contractions at this time  Membranes: ruptured Fetal Monitoring:Baseline: 125 bpm, Variability: Good {> 6 bpm), Accelerations: Reactive and Decelerations: Absent Tocometer: Occasional UC with UI   Labs:  Results for orders placed or performed during the hospital encounter of 01/19/20 (from the past 24 hour(s))  Fern Test   Collection Time: 01/19/20 10:51 PM  Result Value Ref Range   POCT Fern Test Positive = ruptured amniotic membanes     Assessment and Plan: Patient Active Problem List   Diagnosis Date Noted  . Preterm premature rupture of membranes (PPROM) with unknown onset of labor 01/19/2020  . Rh negative state in antepartum period 12/28/2019  . Encounter for supervision of normal first pregnancy in first trimester 08/03/2019   Admit to Antenatal Betamethasone x 2 doses Ampicillin and azithromycin ordered  Will recheck presentation if she does progress in preterm labor to determine route of delivery Routine antenatal care Orders for admission placed, plan for induction at 34 weeks    Sharyon Cable, CNM 01/20/20, 12:11 AM

## 2020-01-20 ENCOUNTER — Encounter (HOSPITAL_COMMUNITY): Payer: Self-pay | Admitting: Family Medicine

## 2020-01-20 ENCOUNTER — Other Ambulatory Visit: Payer: Self-pay

## 2020-01-20 ENCOUNTER — Inpatient Hospital Stay (HOSPITAL_COMMUNITY): Payer: Medicaid Other | Admitting: Anesthesiology

## 2020-01-20 DIAGNOSIS — O42919 Preterm premature rupture of membranes, unspecified as to length of time between rupture and onset of labor, unspecified trimester: Secondary | ICD-10-CM

## 2020-01-20 DIAGNOSIS — O42013 Preterm premature rupture of membranes, onset of labor within 24 hours of rupture, third trimester: Secondary | ICD-10-CM | POA: Diagnosis not present

## 2020-01-20 DIAGNOSIS — Z3A33 33 weeks gestation of pregnancy: Secondary | ICD-10-CM | POA: Diagnosis not present

## 2020-01-20 LAB — CBC
HCT: 32.1 % — ABNORMAL LOW (ref 36.0–46.0)
HCT: 33.5 % — ABNORMAL LOW (ref 36.0–46.0)
Hemoglobin: 10.4 g/dL — ABNORMAL LOW (ref 12.0–15.0)
Hemoglobin: 10.4 g/dL — ABNORMAL LOW (ref 12.0–15.0)
MCH: 26 pg (ref 26.0–34.0)
MCH: 27.2 pg (ref 26.0–34.0)
MCHC: 31 g/dL (ref 30.0–36.0)
MCHC: 32.4 g/dL (ref 30.0–36.0)
MCV: 83.8 fL (ref 80.0–100.0)
MCV: 83.8 fL (ref 80.0–100.0)
Platelets: 317 10*3/uL (ref 150–400)
Platelets: 320 10*3/uL (ref 150–400)
RBC: 3.83 MIL/uL — ABNORMAL LOW (ref 3.87–5.11)
RBC: 4 MIL/uL (ref 3.87–5.11)
RDW: 13.1 % (ref 11.5–15.5)
RDW: 13.1 % (ref 11.5–15.5)
WBC: 13.5 10*3/uL — ABNORMAL HIGH (ref 4.0–10.5)
WBC: 19.4 10*3/uL — ABNORMAL HIGH (ref 4.0–10.5)
nRBC: 0 % (ref 0.0–0.2)
nRBC: 0 % (ref 0.0–0.2)

## 2020-01-20 LAB — TYPE AND SCREEN
ABO/RH(D): B NEG
Antibody Screen: POSITIVE

## 2020-01-20 LAB — WET PREP, GENITAL
Clue Cells Wet Prep HPF POC: NONE SEEN
Sperm: NONE SEEN
Trich, Wet Prep: NONE SEEN
Yeast Wet Prep HPF POC: NONE SEEN

## 2020-01-20 LAB — ABO/RH: ABO/RH(D): B NEG

## 2020-01-20 LAB — RPR: RPR Ser Ql: NONREACTIVE

## 2020-01-20 LAB — SARS CORONAVIRUS 2 BY RT PCR (HOSPITAL ORDER, PERFORMED IN ~~LOC~~ HOSPITAL LAB): SARS Coronavirus 2: NEGATIVE

## 2020-01-20 MED ORDER — BENZOCAINE-MENTHOL 20-0.5 % EX AERO: 1.0000 "application " | INHALATION_SPRAY | CUTANEOUS | Status: DC | PRN

## 2020-01-20 MED ORDER — LACTATED RINGERS IV BOLUS
500.0000 mL | Freq: Once | INTRAVENOUS | Status: AC
  Administered 2020-01-20: 500 mL via INTRAVENOUS

## 2020-01-20 MED ORDER — OXYCODONE-ACETAMINOPHEN 5-325 MG PO TABS: 1.0000 | ORAL_TABLET | ORAL | Status: DC | PRN

## 2020-01-20 MED ORDER — WITCH HAZEL-GLYCERIN EX PADS: 1.0000 "application " | MEDICATED_PAD | CUTANEOUS | Status: DC | PRN

## 2020-01-20 MED ORDER — DIBUCAINE (PERIANAL) 1 % EX OINT: 1.0000 "application " | TOPICAL_OINTMENT | CUTANEOUS | Status: DC | PRN

## 2020-01-20 MED ORDER — SOD CITRATE-CITRIC ACID 500-334 MG/5ML PO SOLN: 30.0000 mL | ORAL | Status: DC | PRN

## 2020-01-20 MED ORDER — FENTANYL CITRATE (PF) 100 MCG/2ML IJ SOLN
50.0000 ug | Freq: Once | INTRAMUSCULAR | Status: AC
  Administered 2020-01-20: 50 ug via INTRAVENOUS
  Filled 2020-01-20: qty 2

## 2020-01-20 MED ORDER — EPHEDRINE 5 MG/ML INJ: 10.0000 mg | INTRAVENOUS | Status: DC | PRN

## 2020-01-20 MED ORDER — ACETAMINOPHEN 325 MG PO TABS
650.0000 mg | ORAL_TABLET | ORAL | Status: DC | PRN
  Administered 2020-01-22: 650 mg via ORAL
  Filled 2020-01-20: qty 2

## 2020-01-20 MED ORDER — LACTATED RINGERS IV SOLN
500.0000 mL | INTRAVENOUS | Status: DC | PRN
  Administered 2020-01-20: 500 mL via INTRAVENOUS

## 2020-01-20 MED ORDER — MEASLES, MUMPS & RUBELLA VAC IJ SOLR: 0.5000 mL | Freq: Once | INTRAMUSCULAR | Status: DC

## 2020-01-20 MED ORDER — TETANUS-DIPHTH-ACELL PERTUSSIS 5-2.5-18.5 LF-MCG/0.5 IM SUSP: 0.5000 mL | Freq: Once | INTRAMUSCULAR | Status: DC

## 2020-01-20 MED ORDER — FENTANYL-BUPIVACAINE-NACL 0.5-0.125-0.9 MG/250ML-% EP SOLN
12.0000 mL/h | EPIDURAL | Status: DC | PRN
  Filled 2020-01-20: qty 250

## 2020-01-20 MED ORDER — SIMETHICONE 80 MG PO CHEW: 80.0000 mg | CHEWABLE_TABLET | ORAL | Status: DC | PRN

## 2020-01-20 MED ORDER — PHENYLEPHRINE 40 MCG/ML (10ML) SYRINGE FOR IV PUSH (FOR BLOOD PRESSURE SUPPORT): 80.0000 ug | PREFILLED_SYRINGE | INTRAVENOUS | Status: DC | PRN

## 2020-01-20 MED ORDER — LACTATED RINGERS IV SOLN: 500.0000 mL | Freq: Once | INTRAVENOUS | Status: DC

## 2020-01-20 MED ORDER — IBUPROFEN 600 MG PO TABS
600.0000 mg | ORAL_TABLET | Freq: Four times a day (QID) | ORAL | Status: DC
  Administered 2020-01-20 – 2020-01-22 (×7): 600 mg via ORAL
  Filled 2020-01-20 (×7): qty 1

## 2020-01-20 MED ORDER — OXYTOCIN-SODIUM CHLORIDE 30-0.9 UT/500ML-% IV SOLN
1.0000 m[IU]/min | INTRAVENOUS | Status: DC
  Administered 2020-01-20: 2 m[IU]/min via INTRAVENOUS

## 2020-01-20 MED ORDER — ACETAMINOPHEN 325 MG PO TABS: 650.0000 mg | ORAL_TABLET | ORAL | Status: DC | PRN

## 2020-01-20 MED ORDER — COCONUT OIL OIL
1.0000 "application " | TOPICAL_OIL | Status: DC | PRN
  Administered 2020-01-21: 1 via TOPICAL

## 2020-01-20 MED ORDER — ONDANSETRON HCL 4 MG/2ML IJ SOLN: 4.0000 mg | INTRAMUSCULAR | Status: DC | PRN

## 2020-01-20 MED ORDER — SENNOSIDES-DOCUSATE SODIUM 8.6-50 MG PO TABS
2.0000 | ORAL_TABLET | ORAL | Status: DC
  Administered 2020-01-20 – 2020-01-21 (×2): 2 via ORAL
  Filled 2020-01-20 (×2): qty 2

## 2020-01-20 MED ORDER — OXYCODONE-ACETAMINOPHEN 5-325 MG PO TABS: 2.0000 | ORAL_TABLET | ORAL | Status: DC | PRN

## 2020-01-20 MED ORDER — TERBUTALINE SULFATE 1 MG/ML IJ SOLN: 0.2500 mg | Freq: Once | INTRAMUSCULAR | Status: DC | PRN

## 2020-01-20 MED ORDER — SODIUM CHLORIDE (PF) 0.9 % IJ SOLN
INTRAMUSCULAR | Status: DC | PRN
  Administered 2020-01-20: 12 mL/h via EPIDURAL

## 2020-01-20 MED ORDER — OXYTOCIN-SODIUM CHLORIDE 30-0.9 UT/500ML-% IV SOLN
2.5000 [IU]/h | INTRAVENOUS | Status: DC
  Filled 2020-01-20: qty 500

## 2020-01-20 MED ORDER — ZOLPIDEM TARTRATE 5 MG PO TABS: 5.0000 mg | ORAL_TABLET | Freq: Every evening | ORAL | Status: DC | PRN

## 2020-01-20 MED ORDER — DIPHENHYDRAMINE HCL 25 MG PO CAPS: 25.0000 mg | ORAL_CAPSULE | Freq: Four times a day (QID) | ORAL | Status: DC | PRN

## 2020-01-20 MED ORDER — PENICILLIN G POT IN DEXTROSE 60000 UNIT/ML IV SOLN: 3.0000 10*6.[IU] | INTRAVENOUS | Status: DC

## 2020-01-20 MED ORDER — SODIUM CHLORIDE 0.9 % IV SOLN
5.0000 10*6.[IU] | Freq: Once | INTRAVENOUS | Status: AC
  Administered 2020-01-20: 5 10*6.[IU] via INTRAVENOUS
  Filled 2020-01-20: qty 5

## 2020-01-20 MED ORDER — LIDOCAINE HCL (PF) 1 % IJ SOLN: 30.0000 mL | INTRAMUSCULAR | Status: DC | PRN

## 2020-01-20 MED ORDER — LACTATED RINGERS IV SOLN
INTRAVENOUS | Status: DC
  Administered 2020-01-20 (×2): 125 mL/h via INTRAVENOUS

## 2020-01-20 MED ORDER — OXYCODONE HCL 5 MG PO TABS: 5.0000 mg | ORAL_TABLET | ORAL | Status: DC | PRN

## 2020-01-20 MED ORDER — LIDOCAINE HCL (PF) 1 % IJ SOLN
INTRAMUSCULAR | Status: DC | PRN
  Administered 2020-01-20: 8 mL via EPIDURAL

## 2020-01-20 MED ORDER — FLEET ENEMA 7-19 GM/118ML RE ENEM: 1.0000 | ENEMA | RECTAL | Status: DC | PRN

## 2020-01-20 MED ORDER — DIPHENHYDRAMINE HCL 50 MG/ML IJ SOLN: 12.5000 mg | INTRAMUSCULAR | Status: DC | PRN

## 2020-01-20 MED ORDER — ONDANSETRON HCL 4 MG/2ML IJ SOLN: 4.0000 mg | Freq: Four times a day (QID) | INTRAMUSCULAR | Status: DC | PRN

## 2020-01-20 MED ORDER — PRENATAL MULTIVITAMIN CH
1.0000 | ORAL_TABLET | Freq: Every day | ORAL | Status: DC
  Administered 2020-01-21: 1 via ORAL
  Filled 2020-01-20: qty 1

## 2020-01-20 MED ORDER — OXYTOCIN BOLUS FROM INFUSION
333.0000 mL | Freq: Once | INTRAVENOUS | Status: AC
  Administered 2020-01-20: 333 mL via INTRAVENOUS

## 2020-01-20 MED ORDER — ONDANSETRON HCL 4 MG PO TABS: 4.0000 mg | ORAL_TABLET | ORAL | Status: DC | PRN

## 2020-01-20 NOTE — Progress Notes (Signed)
Elizabeth Singleton is a 29 y.o. G2P0010 at [redacted]w[redacted]d admitted for PROM  Subjective: Laboring, c/o rectal pressure  Objective: BP Location: Left Arm   Pulse (!) 112    Temp 98.4 F (36.9 C) (Oral)    Resp 20    Ht 5\' 6"  (1.676 m)    Wt 91.6 kg    LMP 05/28/2019    SpO2 96%    BMI 32.60 kg/m  I/O last 3 completed shifts: In: 1167.8 [I.V.:567.8; IV Piggyback:600] Out: -  Total I/O In: -  Out: 925 [Urine:925]  FHT:  FHR: 115 bpm, variability: moderate,  accelerations:  Present,  decelerations:  Present variables UC:   regular, every 3-5 minutes SVE:   Dilation: 10 Effacement (%): 100 Station: Plus 1 Exam by:: [email or web address] SNM  Labs: Lab Results  Component Value Date   WBC 19.4 (H) 01/20/2020   HGB 10.4 (L) 01/20/2020   HCT 32.1 (L) 01/20/2020   MCV 83.8 01/20/2020   PLT 317 01/20/2020    Assessment / Plan: Spontaneous labor, progressing normally  Labor: Progressing; @ BS for SVE & evaluation for pushing Preeclampsia:  NA Fetal Wellbeing:  Category I Pain Control:  Epidural I/D:  GBS cx pending; PCN Anticipated MOD:  NSVD  03/22/2020 01/20/2020, 2:48 PM

## 2020-01-20 NOTE — Progress Notes (Signed)
Patient ID: Elizabeth Singleton, female   DOB: 1991-05-18, 29 y.o.   MRN: [id number] Pit started @ 1423 to aid in ctx for pushing. Pt laboring down @ 1505. FHR 120s, moderate variability, occasional variables. Irregular ctx every 2-4 minutes. Pt resting.

## 2020-01-20 NOTE — Anesthesia Preprocedure Evaluation (Signed)
Anesthesia Evaluation  Patient identified by MRN, date of birth, ID band Patient awake    Reviewed: Allergy & Precautions, H&P , NPO status , Patient's Chart, lab work & pertinent test results  Airway Mallampati: I  TM Distance: >3 FB Neck ROM: full    Dental no notable dental hx. (+) Teeth Intact   Pulmonary neg pulmonary ROS,    Pulmonary exam normal breath sounds clear to auscultation       Cardiovascular negative cardio ROS Normal cardiovascular exam Rhythm:regular Rate:Normal     Neuro/Psych negative neurological ROS  negative psych ROS   GI/Hepatic negative GI ROS, Neg liver ROS,   Endo/Other  negative endocrine ROS  Renal/GU negative Renal ROS  negative genitourinary   Musculoskeletal   Abdominal Normal abdominal exam  (+)   Peds  Hematology  (+) Blood dyscrasia, anemia ,   Anesthesia Other Findings   Reproductive/Obstetrics (+) Pregnancy                             Anesthesia Physical  Anesthesia Plan  ASA: II  Anesthesia Plan: Epidural   Post-op Pain Management:    Induction:   PONV Risk Score and Plan:   Airway Management Planned:   Additional Equipment:   Intra-op Plan:   Post-operative Plan:   Informed Consent: I have reviewed the patients History and Physical, chart, labs and discussed the procedure including the risks, benefits and alternatives for the proposed anesthesia with the patient or authorized representative who has indicated his/her understanding and acceptance.     Plan Discussed with:   Anesthesia Plan Comments:         Anesthesia Quick Evaluation  

## 2020-01-20 NOTE — Progress Notes (Addendum)
Patient ID: Elizabeth Singleton, female   DOB: 1990-07-19, 29 y.o.   MRN: [id number] Patient remains uncomfortable. SVE 4-5/100/-1, vertex To transfer to L & D, may have epidural.

## 2020-01-20 NOTE — Progress Notes (Signed)
Elizabeth Singleton is a 29 y.o. G2P0010 at [redacted]w[redacted]d admitted for PPROM  Subjective: Pt has continued to dilate and labor comfortably   Objective: BP (!) 145/76   Pulse (!) 112   Temp 97.9 F (36.6 C) (Oral)   Resp 18   Ht 5\' 6"  (1.676 m)   Wt 91.6 kg   LMP 05/28/2019   SpO2 96%   BMI 32.60 kg/m  I/O last 3 completed shifts: In: 1167.8 [I.V.:567.8; IV Piggyback:600] Out: -  Total I/O In: -  Out: 925 [Urine:925]  FHT:  FHR: 120 bpm, variability: moderate,  accelerations:  Present,  decelerations:  Absent UC:   regular, every 2-3 minutes SVE:   Dilation: Lip/rim Effacement (%): 100 Station: 0, Plus 1 Exam by:: [email or web address] RN  Labs: Lab Results  Component Value Date   WBC 19.4 (H) 01/20/2020   HGB 10.4 (L) 01/20/2020   HCT 32.1 (L) 01/20/2020   MCV 83.8 01/20/2020   PLT 317 01/20/2020    Assessment / Plan: Labor has continued to spontaneously progress; waiting for pt to have urge to push; anticipating NSVD  Labor: Progressing normally Preeclampsia:  NA Fetal Wellbeing:  Category I Pain Control:  Epidural I/D:  GBX cx pending; currently on PCN Anticipated MOD:  NSVD  03/22/2020 01/20/2020, 12:44 PM

## 2020-01-20 NOTE — Discharge Summary (Signed)
Postpartum Discharge Summary  Date of Service updated 01/22/20     Patient Name: Elizabeth Singleton DOB: 02-Sep-1990 MRN: [id number]  Date of admission: 01/19/2020 Delivery date:01/20/2020  Delivering provider: Serita Grammes D  Date of discharge: 01/22/2020  Admitting diagnosis: Preterm premature rupture of membranes (PPROM) with unknown onset of labor [O42.919] Intrauterine pregnancy: [redacted]w[redacted]d    Secondary diagnosis:  Principal Problem:   Preterm premature rupture of membranes (PPROM) with unknown onset of labor Active Problems:   Rh negative state in antepartum period  Additional problems: none    Discharge diagnosis: Preterm Pregnancy Delivered                                              Post partum procedures:rhogam Augmentation: none Complications: None  Hospital course: Onset of Labor With Vaginal Delivery      29y.o. yo G2P0111 at 356w6das admitted in Latent Labor with PPROM on 01/19/2020. [facility] but early in the morning of 7/10 she began laboring spontaneously, was tx to L&D, and progressed to vag delivery. Membrane Rupture Time/Date: 10:00 PM ,01/19/2020   Delivery Method:Vaginal, Spontaneous  Episiotomy: None  Lacerations:  None  Patient had an uncomplicated postpartum course.  She is ambulating, tolerating a regular diet, passing flatus, and urinating well. Patient is discharged home in stable condition on 01/22/20.  Newborn Data: Birth date:01/20/2020  Birth time:4:24 PM  Gender:Female  Living status:Living  Apgars:7 ,9  Weight:2870 g   Magnesium Sulfate received: No BMZ received: Yes x 1 dose Rhophylac:Yes MMR:N/A T-DaP:Given prenatally Flu: No Transfusion:No  Physical exam  Vitals:   01/21/20 1535 01/21/20 1930 01/21/20 2355 01/22/20 0808  BP: 118/69 121/64 120/72 115/64  Pulse: 90 88 (!) 114 83  Resp: 18 19 18 16   Temp: 97.8 F (36.6 C) 98 F (36.7 C) 98.3 F (36.8 C) 98.1 F (36.7 C)  TempSrc:  Axillary Oral Oral Oral  SpO2: 99% 98% 97% 98%  Weight:      Height:       General: alert Lochia: appropriate Uterine Fundus: firm Incision: Healing well with no significant drainage DVT Evaluation: No evidence of DVT seen on physical exam. Labs: Lab Results  Component Value Date   WBC 19.4 (H) 01/20/2020   HGB 10.4 (L) 01/20/2020   HCT 32.1 (L) 01/20/2020   MCV 83.8 01/20/2020   PLT 317 01/20/2020   No flowsheet data found. Edinburgh Score: Edinburgh Postnatal Depression Scale Screening Tool 01/21/2020  I have been able to laugh and see the funny side of things. 0  I have looked forward with enjoyment to things. 0  I have blamed myself unnecessarily when things went wrong. 0  I have been anxious or worried for no good reason. 0  I have felt scared or panicky for no good reason. 0  Things have been getting on top of me. 0  I have been so unhappy that I have had difficulty sleeping. 0  I have felt sad or miserable. 0  I have been so unhappy that I have been crying. 0  The thought of harming myself has occurred to me. 0  Edinburgh Postnatal Depression Scale Total 0     After visit meds:  Allergies as of 01/22/2020   No Known Allergies     Medication List  STOP taking these medications   acetaminophen 325 MG tablet Commonly known as: TYLENOL   famotidine 20 MG tablet Commonly known as: PEPCID     TAKE these medications   ibuprofen 600 MG tablet Commonly known as: ADVIL Take 1 tablet (600 mg total) by mouth every 6 (six) hours.   PRENATAL VITAMINS PO Take 2 tablets by mouth daily.        Discharge home in stable condition Infant Feeding: Breast Infant Disposition:NICU Discharge instruction: per After Visit Summary and Postpartum booklet. Activity: Advance as tolerated. Pelvic rest for 6 weeks.  Diet: routine diet Future Appointments: No future appointments. Follow up Visit:  [place]. Schedule an appointment as soon as possible for a visit in 4 week(s).   Specialty: Obstetrics and Gynecology Why: 4 weeks for postpartum visit Contact information: [place] [id number] [phone]              Myrtis Ser, CNM  P [facility] Admin Please schedule this patient for Postpartum visit in: 4 weeks with the following provider: Any provider  In-Person  For C/S patients schedule nurse incision check in weeks 2 weeks: no  High risk pregnancy complicated by: PPROM  Delivery mode: SVD  Anticipated Birth Control: vasectomy  PP Procedures needed: none  Schedule Integrated BH visit: no   01/22/2020 Chancy Milroy, MD

## 2020-01-20 NOTE — Progress Notes (Signed)
Patient ID: Elizabeth Singleton, female   DOB: Aug 05, 1990, 29 y.o.   MRN: [id number] FACULTY PRACTICE ANTEPARTUM(COMPREHENSIVE) NOTE  Elizabeth Singleton is a 29 y.o. G2P0010 at [redacted]w[redacted]d by LMP, early ultrasound who is admitted for PROM.   Fetal presentation is cephalic. Length of Stay:  1  Days  ASSESSMENT: Active Problems:   Preterm premature rupture of membranes (PPROM) with unknown onset of labor   PLAN: Monitor closely, as appears to be in early labor-given IVF bolus and Fentanyl. Contractions are 5-6 min apart. If continues, gets closer, will move to L and D for delivery Continue latency Abx S/p BMZ x1  Subjective: Began having contractions at 4:30 am. Coming 2-3 minutes at that time Patient reports the fetal movement as active. Patient reports uterine contraction  activity as regular, every 4-5 minutes. Patient reports  vaginal bleeding as none. Patient describes fluid per vagina as Clear.  Vitals:  Blood pressure 137/85, pulse (!) 115, temperature 98 F (36.7 C), temperature source Oral, resp. rate 16, last menstrual period 05/28/2019, SpO2 99 %, unknown if currently breastfeeding. Physical Examination:  General appearance - alert, well appearing, and in no distress Chest - normal effort Abdomen - gravid, non-tender Fundal Height:  size equals dates Extremities: Homans sign is negative, no sign of DVT  Membranes:ruptured, clear fluid  Fetal Monitoring:  Baseline: 120 bpm, Variability: Good {> 6 bpm), Accelerations: Reactive and Decelerations: Absent  Labs:  Results for orders placed or performed during the hospital encounter of 01/19/20 (from the past 24 hour(s))  Fern Test   Collection Time: 01/19/20 10:51 PM  Result Value Ref Range   POCT Fern Test Positive = ruptured amniotic membanes   Urinalysis, Routine w reflex microscopic   Collection Time: 01/19/20 11:05 PM  Result Value Ref Range   Color, Urine YELLOW YELLOW   APPearance CLOUDY (A) CLEAR   Specific Gravity, Urine 1.003  (L) 1.005 - 1.030   pH 7.0 5.0 - 8.0   Glucose, UA NEGATIVE NEGATIVE mg/dL   Hgb urine dipstick MODERATE (A) NEGATIVE   Bilirubin Urine NEGATIVE NEGATIVE   Ketones, ur NEGATIVE NEGATIVE mg/dL   Protein, ur 656 (A) NEGATIVE mg/dL   Nitrite NEGATIVE NEGATIVE   Leukocytes,Ua NEGATIVE NEGATIVE   RBC / HPF 11-20 0 - 5 RBC/hpf   WBC, UA 6-10 0 - 5 WBC/hpf   Bacteria, UA RARE (A) NONE SEEN   Squamous Epithelial / LPF 6-10 0 - 5   Mucus PRESENT   Wet prep, genital   Collection Time: 01/19/20 11:36 PM   Specimen: PATH Cytology Cervicovaginal Ancillary Only  Result Value Ref Range   Yeast Wet Prep HPF POC NONE SEEN NONE SEEN   Trich, Wet Prep NONE SEEN NONE SEEN   Clue Cells Wet Prep HPF POC NONE SEEN NONE SEEN   WBC, Wet Prep HPF POC FEW (A) NONE SEEN   Sperm NONE SEEN   CBC on admission   Collection Time: 01/19/20 11:36 PM  Result Value Ref Range   WBC 13.5 (H) 4.0 - 10.5 K/uL   RBC 4.00 3.87 - 5.11 MIL/uL   Hemoglobin 10.4 (L) 12.0 - 15.0 g/dL   HCT 81.2 (L) 36 - 46 %   MCV 83.8 80.0 - 100.0 fL   MCH 26.0 26.0 - 34.0 pg   MCHC 31.0 30.0 - 36.0 g/dL   RDW 75.1 70.0 - 17.4 %   Platelets 320 150 - 400 K/uL   nRBC 0.0 0.0 - 0.2 %  Type and screen [facility]   Collection Time: 01/19/20 11:36 PM  Result Value Ref Range   ABO/RH(D) B NEG    Antibody Screen POS    Sample Expiration 01/22/2020,2359    Antibody Identification      PASSIVELY ACQUIRED ANTI-D Performed at [facility], [address], [place], [place] [address]   SARS Coronavirus 2 by RT PCR (hospital order, performed in [facility] hospital lab) Nasopharyngeal Nasopharyngeal Swab   Collection Time: 01/19/20 11:37 PM   Specimen: Nasopharyngeal Swab  Result Value Ref Range   SARS Coronavirus 2 NEGATIVE NEGATIVE    Medications:  Scheduled . [START ON 01/21/2020] amoxicillin  500 mg Oral TID  . betamethasone acetate-betamethasone sodium phosphate  12 mg Intramuscular Q24H  . docusate sodium   100 mg Oral Daily  . prenatal multivitamin  1 tablet Oral Q1200   I have reviewed the patient's current medications.   Reva Bores, MD 01/20/2020,6:45 AM

## 2020-01-20 NOTE — Anesthesia Procedure Notes (Signed)
Epidural Patient location during procedure: OB Start time: 01/20/2020 8:30 AM End time: 01/20/2020 8:36 AM  Staffing Anesthesiologist: Leilani Able, MD Performed: anesthesiologist   Preanesthetic Checklist Completed: patient identified, IV checked, site marked, risks and benefits discussed, surgical consent, monitors and equipment checked, pre-op evaluation and timeout performed  Epidural Patient position: sitting Prep: DuraPrep and site prepped and draped Patient monitoring: continuous pulse ox and blood pressure Approach: midline Location: L3-L4 Injection technique: LOR air  Needle:  Needle type: Tuohy  Needle gauge: 17 G Needle length: 9 cm and 9 Needle insertion depth: 5 cm cm Catheter type: closed end flexible Catheter size: 19 Gauge Catheter at skin depth: 10 cm Test dose: negative and Other  Assessment Events: blood not aspirated, injection not painful, no injection resistance, no paresthesia and negative IV test  Additional Notes Reason for block:procedure for pain

## 2020-01-20 NOTE — Progress Notes (Signed)
Pt states she is having some abdominal pain. Monitors applied to assess uterine activity. Pt is contracting every 2-3 minutes. Dr. Shawnie Pons made aware and instructed RN to give bolus of LR. RN will continue to monitor.

## 2020-01-20 NOTE — Progress Notes (Signed)
Elizabeth Singleton is a 29 y.o. G2P0010 at 109w6d admitted for PPROM  Subjective: Pt comfortable s/p epidural   Objective: BP 114/63    Pulse (!) 116    Temp 97.9 F (36.6 C) (Oral)    Resp 16    Ht 5\' 6"  (1.676 m)    Wt 91.6 kg    LMP 05/28/2019    SpO2 98%    BMI 32.60 kg/m  I/O last 3 completed shifts: In: 1167.8 [I.V.:567.8; IV Piggyback:600] Out: -  No intake/output data recorded.  FHT:  FHR: 115 bpm, variability: moderate,  accelerations:  Present,  decelerations:  Absent UC:   irregular SVE:   Dilation: 6 Effacement (%): 100 Station: -1 Exam by:: [email or web address] SNM  Labs: Lab Results  Component Value Date   WBC 13.5 (H) 01/19/2020   HGB 10.4 (L) 01/19/2020   HCT 33.5 (L) 01/19/2020   MCV 83.8 01/19/2020   PLT 320 01/19/2020    Assessment / Plan: Spontaneous labor, progressing normally  Labor: Discussed POC and labor options. Pt making adequate cervical change; not interventions @ this time. Pt agrees w/ POC. Preeclampsia:  NA Fetal Wellbeing:  Category I Pain Control:  Epidural I/D:  GBS cx pending; prophylactic tx amp/azithro then changed to PCN Anticipated MOD:  NSVD  03/21/2020 01/20/2020, 10:11 AM

## 2020-01-21 MED ORDER — RHO D IMMUNE GLOBULIN 1500 UNIT/2ML IJ SOSY
300.0000 ug | PREFILLED_SYRINGE | Freq: Once | INTRAMUSCULAR | Status: AC
  Administered 2020-01-21: 300 ug via INTRAVENOUS
  Filled 2020-01-21: qty 2

## 2020-01-21 NOTE — Anesthesia Postprocedure Evaluation (Signed)
Anesthesia Post Note  Patient: Elizabeth Singleton  Procedure(s) Performed: AN AD HOC LABOR EPIDURAL     Patient location during evaluation: Mother Baby Anesthesia Type: Epidural Level of consciousness: awake Pain management: pain level controlled Vital Signs Assessment: post-procedure vital signs reviewed and stable Respiratory status: spontaneous breathing Cardiovascular status: stable Postop Assessment: no headache, no backache, epidural receding, patient able to bend at knees, no apparent nausea or vomiting and adequate PO intake Anesthetic complications: no   No complications documented.  Last Vitals:  Vitals:   01/21/20 0325 01/21/20 0354  BP: 113/61 111/68  Pulse: 95 91  Resp: 18 17  Temp: 36.8 C 36.7 C  SpO2: 99% 98%    Last Pain:  Vitals:   01/21/20 0546  TempSrc:   PainSc: 2    Pain Goal: Patients Stated Pain Goal: 1 (01/21/20 0546)                 Caren Macadam

## 2020-01-21 NOTE — Progress Notes (Signed)
POSTPARTUM PROGRESS NOTE  PPD #1  Subjective:  Elizabeth Singleton is a 29 y.o. [id number] s/p vaginal delivery at [redacted]w[redacted]d.  She reports she doing well. No acute events overnight. She reports she is doing well. She denies any problems with ambulating, voiding or po intake. Denies nausea or vomiting. She has passed flatus. Pain is well controlled.  Lochia is minimal.  Objective: Blood pressure 111/68, pulse 91, temperature 98 F (36.7 C), temperature source Oral, resp. rate 17, height 5\' 6"  (1.676 m), weight 91.6 kg, last menstrual period 05/28/2019, SpO2 98 %, unknown if currently breastfeeding.  Physical Exam:  General: alert, cooperative and no distress Chest: no respiratory distress Heart:regular rate, distal pulses intact Abdomen: soft, nontender,  Uterine Fundus: firm, appropriately tender DVT Evaluation: No calf swelling or tenderness Extremities: trace edema Skin: warm, dry;   Recent Labs    01/19/20 2336 01/20/20 1013  HGB 10.4* 10.4*  HCT 33.5* 32.1*    Assessment/Plan: Elizabeth Singleton is a 29 y.o. 26 s/p SVD at [redacted]w[redacted]d for PPROM.  PPD#1 - Doing welll; pain well controlled. H/H appropriate  Routine postpartum care  OOB, ambulated  SCD for VTE prophylaxis Anemia: asymptomatic Feeding: pumping  Dispo: Plan for discharge tomorrow.   LOS: 2 days   [redacted]w[redacted]d, MD  01/21/2020, 7:12 AM

## 2020-01-21 NOTE — Lactation Note (Signed)
This note was copied from a baby's chart. Lactation Consultation Note LC attempted to see mom. Mom sleeping soundly. RN states she has set up mom w/DEBP. NICU mom of 33 6/7 wks.  Patient Name: Elizabeth Singleton [id number] Date: 01/21/2020     Maternal Data    Feeding    LATCH Score                   Interventions    Lactation Tools Discussed/Used     Consult Status      Charyl Dancer 01/21/2020, 4:15 AM

## 2020-01-21 NOTE — Lactation Note (Signed)
This note was copied from a Elizabeth's chart. Lactation Consultation Note  Patient Name: Elizabeth Singleton Date: 01/21/2020  Elizabeth Singleton born at 76 weeks and 6 days gestation now 74 hours old. Mom has started pumping with DEBP.  Mom reports getting drops of colostrum but has not had enough to save yet.  Mom reports pumping every 3 hours. Discussed pumping more often during the day.Urged to try and pump every 2 hours during the day.   Urged to add massage and hand expression to pumping.  Urged mom to watch [name] on pumping video. 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 Mom has medicaid but not on [facility].  Discussed mom applying for [facility] on line.  Mom lives in [place]. [facility] referral sent. Urged mom to call lactation as needed. Left name on board  Maternal Data    Feeding    [facility] Score                   Interventions    Lactation Tools Discussed/Used     Consult Status      [name] Michaelle Copas 01/21/2020, 1:00 PM

## 2020-01-22 LAB — GC/CHLAMYDIA PROBE AMP (~~LOC~~) NOT AT ARMC
Chlamydia: NEGATIVE
Comment: NEGATIVE
Comment: NORMAL
Neisseria Gonorrhea: NEGATIVE

## 2020-01-22 LAB — RH IG WORKUP (INCLUDES ABO/RH)
ABO/RH(D): B NEG
Fetal Screen: NEGATIVE
Gestational Age(Wks): 33
Unit division: 0

## 2020-01-22 LAB — CULTURE, BETA STREP (GROUP B ONLY)

## 2020-01-22 MED ORDER — IBUPROFEN 600 MG PO TABS: 600.0000 mg | ORAL_TABLET | Freq: Four times a day (QID) | ORAL | 0 refills | Status: DC

## 2020-01-22 NOTE — Progress Notes (Signed)
Reviewed discharge instructions with patient and significant other. Reviewed medications, when to call MD/go to MAU, signs and symptoms of pre-e, and to call and schedule follow up appointment in 4 weeks with OB. Patient asked appropriate questions and will visit in NICU and then leave with significant other.

## 2020-01-22 NOTE — Lactation Note (Signed)
This note was copied from a baby's chart. Lactation Consultation Note  Patient Name: Elizabeth Singleton [id number] Date: 01/22/2020 Reason for consult: Follow-up assessment  0808 - 0818 - I followed up with [name]. She has been pumping using her DEBP. She reports that she just got a few drops out earlier and she was able to give those to her son, [name]"). [name] is now 39 hours postpartum. We reviewed pumping output in days 1-3 and when to expect an increase in her milk volume. I provided encouragement and recommended that she continue to pump at least 8 times a day.  Reviewed settings on her DEBP to make sure she was doing everything correctly. She denies pain with pumping. I did not see her pump, but I encouraged her to call lactation if she has any concerns.   Ms. Salmon may be discharged today. She is unsure as to what kind of pump she owns at home. I recommended that she take her pump kit up to [name]'s room so she could pump while with him. I also made her aware that the gift shop has monthly pump rentals.  I encouraged follow up on the NICU floor. [name] desire is to breast feed when [name] is ready.  Maternal Data Has patient been taught Hand Expression?: Yes Does the patient have breastfeeding experience prior to this delivery?: No  Feeding Feeding Type: Donor Breast Milk   Interventions Interventions: Breast feeding basics reviewed;DEBP  Lactation Tools Discussed/Used Tools: Pump Breast pump type: Double-Electric Breast Pump Pump Review: Setup, frequency, and cleaning;Milk Storage   Consult Status Consult Status: Follow-up Date: 01/23/20 Follow-up type: In-patient    Lenore Manner 01/22/2020, 9:04 AM

## 2020-01-22 NOTE — Discharge Instructions (Signed)

## 2020-01-22 NOTE — Progress Notes (Signed)
Patient screened out for psychosocial assessment since none of the following apply:  Psychosocial stressors documented in mother or baby's chart  Gestation less than 32 weeks  Code at delivery   Infant with anomalies Please contact the Clinical Social Worker if specific needs arise, by MOB's request, or if MOB scores greater than 9/yes to question 10 on Edinburgh Postpartum Depression Screen.  Mischa Pollard, LCSW Clinical Social Worker [facility] Cell#: [phone]     

## 2020-01-23 LAB — SURGICAL PATHOLOGY

## 2020-01-28 ENCOUNTER — Ambulatory Visit: Payer: Self-pay

## 2020-01-28 NOTE — Lactation Note (Addendum)
This note was copied from a baby's chart. Lactation Consultation Note  Patient Name: Elizabeth Singleton Date: 01/28/2020   Mom Requests to assist with breastfeeding.  [name] Elizabeth Singleton now 67 days old born at 48 weeks and 6 days gestation.Mom holding him on arrival.  Assist mom in getting comfortable and getting him in close for a good latch.  Assisted mom with breastfeeding him in cross cradle hold on left breast.  Infant latched well with some rythmic sucking and audible swallowing for about 8-10 minutes.  Then fell asleep.  Content.   Discussed limiting his feeds to no more than 30 minutes at the breast and that a close to 8- 10 minute feed was a good start with breastfeeding. Discussed adding gentle massage at the breast while infant sucking if he was able to tolerate it to get more milk.  Mom had questions regading how much milk she should be making, Mom reports she is not making enough.   Mom reports she has a Lansinoh pump at home and feels that it does not do as well as a hospital pump.  Mom has talked with WIC about DEBP.  But mom reports WIC in [place] has a waiting list for pumps. Discussed possibility of [facility] loaner pump until mom can get one from [facility].  Discussed not tiring him out at the breast.  Discussed continuing to pump 8-12 times day.  Discussed continuing to pump past breastfeedings or attempted breastfeeds at this time.  Praised breastfeeding efforts.  Urged mom to follow up with lactation as needed.  Maternal Data    Feeding Feeding Type: Breast Milk  LATCH Score                   Interventions    Lactation Tools Discussed/Used     Consult Status      Elizabeth Singleton Michaelle Copas 01/28/2020, 6:52 PM

## 2020-01-30 ENCOUNTER — Ambulatory Visit: Payer: Self-pay

## 2020-01-30 ENCOUNTER — Encounter: Payer: No Typology Code available for payment source | Admitting: Advanced Practice Midwife

## 2020-01-30 NOTE — Lactation Note (Signed)
This note was copied from a baby's chart. Lactation Consultation Note  Patient Name: [name] GLOVF'I Date: 01/30/2020 Reason for consult: Follow-up assessment;Mother's request;1st time breastfeeding;NICU baby;Preterm <34wks  RN paged lactation to help with breast feeding. [name] is beginning his 72 hour breast feeding window. I assisted Elizabeth Singleton with positioning him on the right breast in football hold. Lick and learn observed with expressed breast milk. Baby held nipple in mouth a few times but did not sustain suckle.  I placed a size 24 nipple shield on the breast. Baby latched to the breast with more rhythmic suckling sequences punctuated by long breathing/rest breasts. He held the nipple shield in his mouth and resumed intermittent suckling. Good jaw movement noted, and the nipple shield seemed an appropriate fit.  I encouraged Elizabeth Singleton that based on [name]'s GA, he is doing an excellent job (developmentally appropriate) at the breast. I encouraged her to continue to put to breast while she is rooming in, and to post-pump.  She still does not have a symphony pump at home. Elizabeth Singleton informed her that there was a wait list. She called again today. She may need a loaner pump at the end of this 72 hour window. Lactation to help, as able, with ensuring she has a good pump at home. She has a Lansinoh, but she states that she is not able to express as much milk with this pump.  Maternal Data Has patient been taught Hand Expression?: Yes  Feeding Feeding Type: Breast Fed  LATCH Score Latch: Repeated attempts needed to sustain latch, nipple held in mouth throughout feeding, stimulation needed to elicit sucking reflex.  Audible Swallowing: A few with stimulation  Type of Nipple: Everted at rest and after stimulation  Comfort (Breast/Nipple): Soft / non-tender  Hold (Positioning): Assistance needed to correctly position infant at breast and maintain latch.  LATCH Score:  7  Interventions Interventions: Breast feeding basics reviewed;Assisted with latch;Hand express;Breast compression;Adjust position;Support pillows  Lactation Tools Discussed/Used Tools: Nipple Shields Nipple shield size: 24 Breast pump type: Double-Electric Breast Pump WIC Program: Yes Pump Review: Setup, frequency, and cleaning   Consult Status Consult Status: Follow-up Date: 01/31/20 Follow-up type: In-patient    Walker Shadow 01/30/2020, 3:19 PM

## 2020-01-31 ENCOUNTER — Ambulatory Visit: Payer: Self-pay

## 2020-01-31 NOTE — Lactation Note (Signed)
This note was copied from a baby's chart. Lactation Consultation Note  Patient Name: Elizabeth Singleton Date: 01/31/2020 Reason for consult: Follow-up assessment;NICU baby    Lajoyce Corners used to make a hands free bra.  LC washed pump parts and set mom up to use DEBP.  Mom was excited to have her hands free and pump both sides at once.  She was encouraged to use her hands to massage breasts during pumping.  LC wrote down [email or web address] website and encouraged mom to view the hands on pumping download.      Maternal Data    Feeding Feeding Type: Breast Fed  LATCH Score Latch: Repeated attempts needed to sustain latch, nipple held in mouth throughout feeding, stimulation needed to elicit sucking reflex.  Audible Swallowing: A few with stimulation  Type of Nipple: Everted at rest and after stimulation  Comfort (Breast/Nipple): Soft / non-tender  Hold (Positioning): Assistance needed to correctly position infant at breast and maintain latch.  LATCH Score: 7  Interventions Interventions: Breast feeding basics reviewed;Assisted with latch;Skin to skin;Breast massage;Hand express;Position options  Lactation Tools Discussed/Used Tools: Pump;Nipple Shields Nipple shield size: 24 Breast pump type: Double-Electric Breast Pump   Consult Status Consult Status: Follow-up Date: 02/01/20 Follow-up type: In-patient    Elizabeth Singleton [facility] 01/31/2020, 11:04 AM

## 2020-01-31 NOTE — Lactation Note (Signed)
This note was copied from a baby's chart. Lactation Consultation Note  Patient Name: Elizabeth Singleton QIHKV'[id number] Date: 01/31/2020 Reason for consult: Follow-up assessment;NICU baby   LC entered prior to 9am feeding.  Mom slept longer and hasn't pumped since 345am.  Breast are full and LC encouraged some hand expression prior to latching infant.  Discussed importance of not having breast overly full prior to latching and feeding.    Infant latched in football hold using NS 24 shield.  Infant alert, opened widely and latched easily holding nipple in mouth.  Mom used light compression and infant began rhythmically sucking.  After several seconds, milk flow began to increase as jaw movements deepened but infant came off, infant relatched and fed for approx. 5 minutes.  Mom was able to hear swallows.   LC suggested feeding infant in "frog" or laid back tummy to tummy so milk flow may be controled more easily by infant.  Infant latched again and fed for 15 minutes actively sucking with a few extended pauses.  Multiple swallows heard.  Mom had softened breast after feeding.   Infant stopped at 20 minutes.  No rooting cues noted.  Body relaxed mom held baby STS.    Mom doesn't have a hands free bra.  LC will make hands free bra for mom.    Education provided on pumping and hand expressing afterwards.  LC praised mom and infant for successfully latching and for moms pumping efforts.    All questions and concerns addressed.    Maternal Data    Feeding Feeding Type: Breast Fed  LATCH Score Latch: Repeated attempts needed to sustain latch, nipple held in mouth throughout feeding, stimulation needed to elicit sucking reflex.  Audible Swallowing: A few with stimulation  Type of Nipple: Everted at rest and after stimulation  Comfort (Breast/Nipple): Soft / non-tender  Hold (Positioning): Assistance needed to correctly position infant at breast and maintain latch.  LATCH Score:  7  Interventions Interventions: Breast feeding basics reviewed;Assisted with latch;Skin to skin;Breast massage;Hand express;Position options  Lactation Tools Discussed/Used Tools: Pump;Nipple Shields Nipple shield size: 24 Breast pump type: Double-Electric Breast Pump   Consult Status Consult Status: Follow-up Date: 02/01/20 Follow-up type: In-patient    Maryruth [facility] 01/31/2020, 9:46 AM

## 2020-02-01 ENCOUNTER — Ambulatory Visit: Payer: Self-pay

## 2020-02-01 NOTE — Lactation Note (Signed)
This note was copied from a baby's chart. Lactation Consultation Note  Patient Name: Boy Kalah Pflum IHKVQ'Q Date: 02/01/2020   Infant is 71 days old & 72-hr window ends tomorrow at noon. Mom is a P1.  Germaine Pomfret, RN had a question. Mom reported that she had been told to pump 1hr before feedings at the breast. I visited with Mom to clarify. Mom states that infant coughed at breast yesterday and was told by [facility] to do so.  Mom reports infant has only coughed at breast on one occasion, which was yesterday when [facility] was present. Mom pumps q2h during the day & q3-4h at night pumping 60 mL/15-min session.   I suggested that Mom pump after feedings, instead, unless she witnesses additional coughing episodes at the breast. Mom verbalized understanding & was in agreement.   Parents were wondering if infant may want to feed more often than q3h as they felt they had seen some additional feeding cues since the last feeding. I made Germaine Pomfret, RN aware of parents' question & she will return to room when able.   Lurline Hare [facility] 02/01/2020, 2:16 PM

## 2020-02-09 ENCOUNTER — Ambulatory Visit: Payer: Self-pay

## 2020-02-09 NOTE — Lactation Note (Signed)
This note was copied from a baby's chart. Lactation Consultation Note  Patient Name: Elizabeth Singleton VOJJK'K Date: 02/09/2020 Reason for consult: Follow-up assessment;Mother's request;1st time breastfeeding;Primapara;NICU baby;Preterm <34wks;Late-preterm 34-36.6wks  1447 - 1523 - I followed up with Elizabeth Singleton and observed/assisted with latching [name]. He latched to the left breast first for about 7 minutes. He became sleepy, so we removed him from the breast, burped him, and placed him on the right breast for an additional 8 minutes. Baby fed in cross cradle to cradle hold using a size 24 nipple shield.  He became sleepy again, and then we burped him again and placed him on the right breast again for an additional 5 minutes. Baby fed well at the breast with rhythmic suckling sequences and swallows.  We discussed pumping. She pumps 2 ounces from the right and one from the left. She is using a Lansinoh pump at home. She has not received a pump from [facility] but is on the wait list. I reminded her that we rent the pump through the gift shop.  Baby may go ad lib soon. I recommended that she have a follow up OP appointment after discharge to do a weighted feeding and work on removal of the nipple shield.  Her mother was her support person. Lots of education conducted at this visit.  I agreed to call WIC, and after my visit, I did. [facility] still has her on the wait list. No pumps available.. I also called the [facility] breast feeding hotline and left a voicemail.  I agreed to put in an OP referral for S. Hice. [name] would  Maternal Data Does the patient have breastfeeding experience prior to this delivery?: No  Feeding Feeding Type: Breast Fed Nipple Type: Dr. Levert Feinstein Preemie  LATCH Score Latch: Grasps breast easily, tongue down, lips flanged, rhythmical sucking.  Audible Swallowing: Spontaneous and intermittent  Type of Nipple: Everted at rest and after  stimulation  Comfort (Breast/Nipple): Soft / non-tender  Hold (Positioning): No assistance needed to correctly position infant at breast.  LATCH Score: 10  Interventions Interventions: Breast feeding basics reviewed;Assisted with latch;Hand express;Breast compression;Support pillows;DEBP  Lactation Tools Discussed/Used Tools: Pump Nipple shield size: 24 Breast pump type: Double-Electric Breast Pump WIC Program: Yes Pump Review: Setup, frequency, and cleaning   Consult Status Consult Status: Follow-up Date: 02/11/20 Follow-up type: In-patient    Walker Shadow 02/09/2020, 3:38 PM

## 2020-02-11 DIAGNOSIS — Z419 Encounter for procedure for purposes other than remedying health state, unspecified: Secondary | ICD-10-CM | POA: Diagnosis not present

## 2020-02-12 ENCOUNTER — Ambulatory Visit: Payer: Self-pay

## 2020-02-12 NOTE — Lactation Note (Signed)
This note was copied from a baby's chart. Lactation Consultation Note  Patient Name: Elizabeth Singleton KGURK'Y Date: 02/12/2020 Reason for consult: Follow-up assessment;Primapara;1st time breastfeeding;NICU baby;Preterm <34wks  LC in to visit with P1 Mom and FOB on day of discharge.  Baby 63 weeks old and [redacted]w[redacted]d AGA and 7 lbs 0.7 oz.    Mom feeding baby a paced bottle of fortified EBM with ultra premie nipple.    Mom has been pumping consistently using a Lansinoh DEBP at home and using the [employer] DEBP when here with baby.  Mom has been expressing about 90 ml per session.    Mom's desire is to exclusively breastfeed.  Baby has been latching occasionally and Mom feels that baby latches well.  Recommended strongly that Mom has an OP lactation consultation within a week.  Explained to Mom what the consult would consist of.  Mom is interested and states she will call.  Offered to set up so clinic would call her.  [facility] phone number given to parents, which is in [place].   Interventions Interventions: Breast feeding basics reviewed;Skin to skin;Breast massage;Hand express;DEBP  Lactation Tools Discussed/Used Tools: Pump;Bottle Breast pump type: Double-Electric Breast Pump   Consult Status Consult Status: Complete Date: 02/12/20 Follow-up type: Call as needed    Judee Clara 02/12/2020, 12:31 PM

## 2020-02-29 ENCOUNTER — Encounter: Payer: Self-pay | Admitting: Family Medicine

## 2020-02-29 ENCOUNTER — Ambulatory Visit (INDEPENDENT_AMBULATORY_CARE_PROVIDER_SITE_OTHER): Payer: No Typology Code available for payment source | Admitting: Family Medicine

## 2020-02-29 ENCOUNTER — Other Ambulatory Visit: Payer: Self-pay

## 2020-02-29 NOTE — Progress Notes (Signed)
    Post Partum Visit Note  Elizabeth Singleton is a 29 y.o. [phone] female who presents for a postpartum visit. She is 5 weeks postpartum following a normal spontaneous vaginal delivery.  I have fully reviewed the prenatal and intrapartum course. The delivery was at 33-6gestational weeks.  Anesthesia: epidural. Postpartum course has been normal. Baby is doing well. Baby is feeding by both breast and bottle - Similac Neosure. Bleeding no bleeding. Bowel function is normal. Bladder function is normal. Patient is not sexually active. Contraception method is vasectomy. Postpartum depression screening: negative.     Edinburgh Postnatal Depression Scale - 02/29/20 1019      Edinburgh Postnatal Depression Scale:  In the Past 7 Days   I have been able to laugh and see the funny side of things. 0    I have looked forward with enjoyment to things. 0    I have blamed myself unnecessarily when things went wrong. 0    I have been anxious or worried for no good reason. 0    I have felt scared or panicky for no good reason. 0    Things have been getting on top of me. 0    I have been so unhappy that I have had difficulty sleeping. 0    I have felt sad or miserable. 0    I have been so unhappy that I have been crying. 0    The thought of harming myself has occurred to me. 0    Edinburgh Postnatal Depression Scale Total 0            The following portions of the patient's history were reviewed and updated as appropriate: allergies, current medications, past family history, past medical history, past social history, past surgical history and problem list.  Review of Systems Pertinent items are noted in HPI.    Objective:  Blood pressure 99/76, pulse 69, height 5\' 6"  (1.676 m), weight 186 lb (84.4 kg), last menstrual period 05/28/2019, unknown if currently breastfeeding.  General:  alert, cooperative and no distress  Lungs: clear to auscultation bilaterally  Heart:  regular rate and rhythm, S1, S2  normal, no murmur, click, rub or gallop  Abdomen: soft, non-tender; bowel sounds normal; no masses,  no organomegaly        Assessment:    postpartum exam. Pap smear not done at today's visit.   Plan:   Essential components of care per ACOG recommendations:  1.  Mood and well being: Patient with negative depression screening today. Reviewed local resources for support.  - Patient does not use tobacco. - hx of drug use? No   2. Infant care and feeding:  -Patient currently breastmilk feeding? Yes  -Social determinants of health (SDOH) reviewed in EPIC. No concerns  3. Sexuality, contraception and birth spacing - Patient does not want a pregnancy in the next year.   - Reviewed forms of contraception in tiered fashion. Patient desired vasectomy. Will use condoms until vasectomy completed   - Discussed birth spacing of 18 months  4. Sleep and fatigue -Encouraged family/partner/community support of 4 hrs of uninterrupted sleep to help with mood and fatigue  5. Physical Recovery  - Discussed patients delivery and complications - Patient has urinary incontinence? No  - Patient is safe to resume physical and sexual activity  6.  Health Maintenance - Last pap smear done 07/2019 and was normal with negative HPV.  08/2019, DO [facility], [facility]

## 2020-03-13 DIAGNOSIS — Z419 Encounter for procedure for purposes other than remedying health state, unspecified: Secondary | ICD-10-CM | POA: Diagnosis not present

## 2020-04-12 DIAGNOSIS — Z419 Encounter for procedure for purposes other than remedying health state, unspecified: Secondary | ICD-10-CM | POA: Diagnosis not present

## 2020-04-22 ENCOUNTER — Encounter: Payer: Self-pay | Admitting: Nurse Practitioner

## 2020-04-22 ENCOUNTER — Other Ambulatory Visit: Payer: Self-pay | Admitting: Nurse Practitioner

## 2020-04-22 ENCOUNTER — Telehealth (INDEPENDENT_AMBULATORY_CARE_PROVIDER_SITE_OTHER): Payer: No Typology Code available for payment source | Admitting: Nurse Practitioner

## 2020-04-22 VITALS — Temp 99.3°F | Ht 66.0 in | Wt 180.6 lb

## 2020-04-22 DIAGNOSIS — Z20822 Contact with and (suspected) exposure to covid-19: Secondary | ICD-10-CM

## 2020-04-22 DIAGNOSIS — U071 COVID-19: Secondary | ICD-10-CM

## 2020-04-22 DIAGNOSIS — R059 Cough, unspecified: Secondary | ICD-10-CM

## 2020-04-22 LAB — POC COVID19 BINAXNOW: SARS Coronavirus 2 Ag: POSITIVE — AB

## 2020-04-22 NOTE — Progress Notes (Signed)
Acute Office Visit  Subjective:    Patient ID: Elizabeth Singleton, female    DOB: 1990/08/21, 29 y.o.   MRN: [id number]  Chief Complaint  Patient presents with  . Cough        HPI  No past medical history on file.  Past Surgical History:  Procedure Laterality Date  . TONSILLECTOMY    . TONSILLECTOMY      Family History  Problem Relation Age of Onset  . Hypertension Father   . Hypertension Paternal Grandmother   . Diabetes Neg Hx   . Cancer Neg Hx     Social History   Socioeconomic History  . Marital status: Married    Spouse name: Not on file  . Number of children: Not on file  . Years of education: Not on file  . Highest education level: Not on file  Occupational History  . Not on file  Tobacco Use  . Smoking status: Never Smoker  . Smokeless tobacco: Never Used  Vaping Use  . Vaping Use: Never used  Substance and Sexual Activity  . Alcohol use: Never  . Drug use: Never  . Sexual activity: Yes    Birth control/protection: None  Other Topics Concern  . Not on file  Social History Narrative  . Not on file   Social Determinants of Health   Financial Resource Strain:   . Difficulty of Paying Living Expenses: Not on file  Food Insecurity:   . Worried About Programme researcher, broadcasting/film/video in the Last Year: Not on file  . Ran Out of Food in the Last Year: Not on file  Transportation Needs:   . Lack of Transportation (Medical): Not on file  . Lack of Transportation (Non-Medical): Not on file  Physical Activity:   . Days of Exercise per Week: Not on file  . Minutes of Exercise per Session: Not on file  Stress:   . Feeling of Stress : Not on file  Social Connections:   . Frequency of Communication with Friends and Family: Not on file  . Frequency of Social Gatherings with Friends and Family: Not on file  . Attends Religious Services: Not on file  . Active Member of Clubs or Organizations: Not on file  . Attends Banker Meetings: Not on file  . Marital  Status: Not on file  Intimate Partner Violence:   . Fear of Current or Ex-Partner: Not on file  . Emotionally Abused: Not on file  . Physically Abused: Not on file  . Sexually Abused: Not on file    Outpatient Medications Prior to Visit  Medication Sig Dispense Refill  . ibuprofen (ADVIL) 600 MG tablet Take 1 tablet (600 mg total) by mouth every 6 (six) hours. 30 tablet 0  . Prenatal Multivit-Min-Fe-FA (PRENATAL VITAMINS PO) Take 2 tablets by mouth daily.      No facility-administered medications prior to visit.      Review of Systems     Objective:    Physical Exam  Temp 99.2 F (37.3 C)   Ht 5\' 6"  (1.676 m)   Wt 180 lb 9.6 oz (81.9 kg)   BMI 29.15 kg/m  Wt Readings from Last 3 Encounters:  04/22/20 180 lb 9.6 oz (81.9 kg)  02/29/20 186 lb (84.4 kg)  01/20/20 202 lb (91.6 kg)    Health Maintenance Due  Topic Date Due  . Hepatitis C Screening  Never done  . COVID-19 Vaccine (2 - Pfizer 2-dose series) 04/23/2020  Lab Results  Component Value Date   WBC 19.4 (H) 01/20/2020   HGB 10.4 (L) 01/20/2020   HCT 32.1 (L) 01/20/2020   MCV 83.8 01/20/2020   PLT 317 01/20/2020

## 2020-04-22 NOTE — Progress Notes (Signed)
Patient has been notified of positive COVID-19 rapid test. Referral made for monoclonal antibody infusion.

## 2020-04-22 NOTE — Progress Notes (Signed)
Virtual Visit via Telephone Note   This visit type was conducted due to national recommendations for restrictions regarding the COVID-19 Pandemic (e.g. social distancing) in an effort to limit this patient's exposure and mitigate transmission in our community.  Due to her co-morbid illnesses, this patient is at least at moderate risk for complications without adequate follow up.  This format is felt to be most appropriate for this patient at this time.  The patient did not have access to video technology/had technical difficulties with video requiring transitioning to audio format only (telephone).  All issues noted in this document were discussed and addressed.  No physical exam could be performed with this format.  Patient verbally consented to a telehealth visit.   Date:  04/22/2020   ID:  Elizabeth Singleton, DOB [birth year], MRN [id number]  Patient Location: Home Provider Location: Office/Clinic  PCP:  Marianne Sofia, PA-C   Evaluation Performed:  New Patient Evaluation  Chief Complaint:  Fever, Chills  History of Present Illness:    Elizabeth Singleton is a 29 y.o. female with fever, chills, sweating, headache, cough, and congestion. She also had intermittent nausea and diarrhea. Serrita states the onset of symptoms began 48 hours ago. Her spouse had a positive COVID-19 test 4 days ago. She performed a home COVID-19 test on Saturday, 04-20-20, which was positive. A rapid COVID-19 test was confirmed positive at the clinic today Elizabeth Singleton is approximately 13 weeks post-partum. She is no longer breastfeeding. She has requested a monoclonal antibody infusion.  The patient does have symptoms concerning for COVID-19 infection (fever, chills, cough, or new shortness of breath).    History reviewed. No pertinent past medical history.  Past Surgical History:  Procedure Laterality Date  . TONSILLECTOMY    . TONSILLECTOMY      Family History  Problem Relation Age of Onset  . Hypertension Father   .  Hypertension Paternal Grandmother   . Diabetes Neg Hx   . Cancer Neg Hx     Social History   Socioeconomic History  . Marital status: Married    Spouse name: Not on file  . Number of children: Not on file  . Years of education: Not on file  . Highest education level: Not on file  Occupational History  . Not on file  Tobacco Use  . Smoking status: Never Smoker  . Smokeless tobacco: Never Used  Vaping Use  . Vaping Use: Never used  Substance and Sexual Activity  . Alcohol use: Never  . Drug use: Never  . Sexual activity: Yes    Birth control/protection: None  Other Topics Concern  . Not on file  Social History Narrative  . Not on file   Social Determinants of Health   Financial Resource Strain:   . Difficulty of Paying Living Expenses: Not on file  Food Insecurity:   . Worried About Programme researcher, broadcasting/film/video in the Last Year: Not on file  . Ran Out of Food in the Last Year: Not on file  Transportation Needs:   . Lack of Transportation (Medical): Not on file  . Lack of Transportation (Non-Medical): Not on file  Physical Activity:   . Days of Exercise per Week: Not on file  . Minutes of Exercise per Session: Not on file  Stress:   . Feeling of Stress : Not on file  Social Connections:   . Frequency of Communication with Friends and Family: Not on file  . Frequency of Social Gatherings with Friends and Family: Not  on file  . Attends Religious Services: Not on file  . Active Member of Clubs or Organizations: Not on file  . Attends Banker Meetings: Not on file  . Marital Status: Not on file  Intimate Partner Violence:   . Fear of Current or Ex-Partner: Not on file  . Emotionally Abused: Not on file  . Physically Abused: Not on file  . Sexually Abused: Not on file    Outpatient Medications Prior to Visit  Medication Sig Dispense Refill  . ibuprofen (ADVIL) 600 MG tablet Take 1 tablet (600 mg total) by mouth every 6 (six) hours. 30 tablet 0  . Prenatal  Multivit-Min-Fe-FA (PRENATAL VITAMINS PO) Take 2 tablets by mouth daily.      No facility-administered medications prior to visit.    Allergies:   Patient has no known allergies.   Social History   Tobacco Use  . Smoking status: Never Smoker  . Smokeless tobacco: Never Used  Vaping Use  . Vaping Use: Never used  Substance Use Topics  . Alcohol use: Never  . Drug use: Never     Review of Systems  Constitutional: Positive for chills, fever and malaise/fatigue.  HENT: Positive for congestion. Negative for ear pain, sinus pain and sore throat.   Eyes: Negative for pain.  Respiratory: Positive for cough. Negative for shortness of breath.   Cardiovascular: Negative for chest pain and leg swelling.  Gastrointestinal: Positive for diarrhea and nausea. Negative for vomiting.  Genitourinary: Negative for dysuria.  Musculoskeletal: Positive for myalgias.  Skin: Negative for rash.  Neurological: Positive for headaches.     Labs/Other Tests and Data Reviewed:    Recent Labs: 01/20/2020: Hemoglobin 10.4; Platelets 317   Recent Lipid Panel No results found for: CHOL, TRIG, HDL, CHOLHDL, LDLCALC, LDLDIRECT  Wt Readings from Last 3 Encounters:  04/22/20 180 lb 9.6 oz (81.9 kg)  02/29/20 186 lb (84.4 kg)  01/20/20 202 lb (91.6 kg)     Objective:    Vital Signs:  Temp 99.3 F (37.4 C) (Oral)   Ht 5\' 6"  (1.676 m)   Wt 180 lb 9.6 oz (81.9 kg)   BMI 29.15 kg/m    Physical Exam No physical exam performed due to positive COVID-19 test  ASSESSMENT & PLAN:   1. Cough - POC COVID-19 BinaxNow  2. Exposure to confirmed case of COVID-19 - POC COVID-19 BinaxNow  3. COVID-19 determined by clinical diagnostic criteria   Referral was made to [facility] for monoclonal antibody infusion  Orders Placed This Encounter  Procedures  . POC COVID-19 BinaxNow     Signs and symptoms of COVID-19 were discussed with the patient and how to seek care for testing (follow up  with PCP or arrange E-visit). The importance of social distancing was discussed today.  Time:   Today, I have spent 15 minutes with the patient with telehealth technology discussing the above problems.    Follow Up:  Virtual Visit  prn  Signed, [facility], NP  04/22/2020 11:41 PM    [facility] [place]

## 2020-04-23 ENCOUNTER — Other Ambulatory Visit: Payer: Self-pay | Admitting: Nurse Practitioner

## 2020-04-23 ENCOUNTER — Encounter: Payer: Self-pay | Admitting: Nurse Practitioner

## 2020-04-23 DIAGNOSIS — Z6829 Body mass index (BMI) 29.0-29.9, adult: Secondary | ICD-10-CM

## 2020-04-23 DIAGNOSIS — U071 COVID-19: Secondary | ICD-10-CM

## 2020-04-23 NOTE — Progress Notes (Signed)
I connected by phone with Elizabeth Singleton on 04/23/2020 at 2:05 PM to discuss the potential use of a new treatment for mild to moderate COVID-19 viral infection in non-hospitalized patients.  This patient is a 29 y.o. female that meets the FDA criteria for Emergency Use Authorization of COVID monoclonal antibody casirivimab/imdevimab or bamlanivimab/eteseviamb.  Has a (+) direct SARS-CoV-2 viral test result  Has mild or moderate COVID-19   Is NOT hospitalized due to COVID-19  Is within 10 days of symptom onset  Has at least one of the high risk factor(s) for progression to severe COVID-19 and/or hospitalization as defined in EUA.  Specific high risk criteria : BMI > 25   I have spoken and communicated the following to the patient or parent/caregiver regarding COVID monoclonal antibody treatment:  1. FDA has authorized the emergency use for the treatment of mild to moderate COVID-19 in adults and pediatric patients with positive results of direct SARS-CoV-2 viral testing who are 19 years of age and older weighing at least 40 kg, and who are at high risk for progressing to severe COVID-19 and/or hospitalization.  2. The significant known and potential risks and benefits of COVID monoclonal antibody, and the extent to which such potential risks and benefits are unknown.  3. Information on available alternative treatments and the risks and benefits of those alternatives, including clinical trials.  4. Patients treated with COVID monoclonal antibody should continue to self-isolate and use infection control measures (e.g., wear mask, isolate, social distance, avoid sharing personal items, clean and disinfect "high touch" surfaces, and frequent handwashing) according to CDC guidelines.   5. The patient or parent/caregiver has the option to accept or refuse COVID monoclonal antibody treatment.  After reviewing this information with the patient, the patient has agreed to receive one of the available  covid 19 monoclonal antibodies and will be provided an appropriate fact sheet prior to infusion. [name], NP 04/23/2020 2:05 PM

## 2020-04-24 ENCOUNTER — Ambulatory Visit (HOSPITAL_COMMUNITY)
Admission: RE | Admit: 2020-04-24 | Discharge: 2020-04-24 | Disposition: A | Discharge status: Home / Self Care | Payer: No Typology Code available for payment source | Source: Ambulatory Visit | Attending: Pulmonary Disease | Admitting: Pulmonary Disease

## 2020-04-24 DIAGNOSIS — Z6829 Body mass index (BMI) 29.0-29.9, adult: Secondary | ICD-10-CM | POA: Diagnosis not present

## 2020-04-24 DIAGNOSIS — U071 COVID-19: Secondary | ICD-10-CM

## 2020-04-24 MED ORDER — DIPHENHYDRAMINE HCL 50 MG/ML IJ SOLN: 50.0000 mg | Freq: Once | INTRAMUSCULAR | Status: DC | PRN

## 2020-04-24 MED ORDER — METHYLPREDNISOLONE SODIUM SUCC 125 MG IJ SOLR
125.0000 mg | Freq: Once | INTRAMUSCULAR | Status: AC | PRN
  Administered 2020-04-24: 125 mg via INTRAVENOUS
  Filled 2020-04-24: qty 2

## 2020-04-24 MED ORDER — EPINEPHRINE 0.3 MG/0.3ML IJ SOAJ: 0.3000 mg | Freq: Once | INTRAMUSCULAR | Status: DC | PRN

## 2020-04-24 MED ORDER — SODIUM CHLORIDE 0.9 % IV SOLN: Freq: Once | INTRAVENOUS | Status: AC

## 2020-04-24 MED ORDER — SODIUM CHLORIDE 0.9 % IV SOLN: INTRAVENOUS | Status: DC | PRN

## 2020-04-24 MED ORDER — FAMOTIDINE IN NACL 20-0.9 MG/50ML-% IV SOLN
20.0000 mg | Freq: Once | INTRAVENOUS | Status: DC | PRN
  Filled 2020-04-24: qty 50

## 2020-04-24 MED ORDER — ALBUTEROL SULFATE HFA 108 (90 BASE) MCG/ACT IN AERS: 2.0000 | INHALATION_SPRAY | Freq: Once | RESPIRATORY_TRACT | Status: DC | PRN

## 2020-04-24 NOTE — Progress Notes (Signed)
Ms. Matar arrived to the infusion clinic to receive Bamlanivimab and etesevimab. Within 2 minutes following the initiation of the medication she began having shortness of breath, numbness/tingling and flushing. The medication was immediately stopped. On evaluation her breathing was improved, however continued to feel some numbness and had redness of her skin. No signs of angioedema. Appears consistent with allergic reaction to medication. Treated with Solumedrol and observed. On recheck was improved from previous with symptom resolution.Discussed that we are unable to continue with medication administration and will need to continue with symptom management.    Marcos Eke, NP 04/24/2020 11:14 AM

## 2020-04-24 NOTE — Progress Notes (Signed)
Pt discharged from infusion clinic with no signs of allergic reaction. Skin WNL, RR even and unlabored, no body numbness are reported at initial start of monoclonal infusion. Pt did not receive any more monoclonal infusion after stopping at 10:45.

## 2020-04-24 NOTE — Progress Notes (Addendum)
Pt monoclonal antibody infusion started at 1043AM.   At approx 1045AM with RN at bedside of patient, she began to state that she could not breathe, felt numb in her body, sat up in chair and was visibly reddened to upper extremities, face and neck. This RN stopped infusion immediately, obtained VS and requested NP, Jeanine Luz in clinic to come to bedside immediately. Pt oxygen saturation and RR rate remained WDL. Pt was assessed by NP, verbal order to discontinue the antibody infusion and to give 125mg  solumedrol PIV. Pt stated that SOB had resolved and her body did not feel tingly anymore, while redness of skin remained.  1053AM-This RN administered the solumedrol and continued to clinically monitor patient for any continuing adverse reactions. Pt skin now back to normal color and states that the breathing difficulty and numbness is resolved. Pt alert and oriented X 4, RR even and unlabored, pt in NAD at this time. Pt call bell at bedside and understands use of call bell. Will continue to monitor patient.

## 2020-04-24 NOTE — Discharge Instructions (Signed)
10 Things You Can Do to Manage Your COVID-19 Symptoms at Home If you have possible or confirmed COVID-19: 1. Stay home from work and school. And stay away from other public places. If you must go out, avoid using any kind of public transportation, ridesharing, or taxis. 2. Monitor your symptoms carefully. If your symptoms get worse, call your healthcare provider immediately. 3. Get rest and stay hydrated. 4. If you have a medical appointment, call the healthcare provider ahead of time and tell them that you have or may have COVID-19. 5. For medical emergencies, call 911 and notify the dispatch personnel that you have or may have COVID-19. 6. Cover your cough and sneezes with a tissue or use the inside of your elbow. 7. Wash your hands often with soap and water for at least 20 seconds or clean your hands with an alcohol-based hand sanitizer that contains at least 60% alcohol. 8. As much as possible, stay in a specific room and away from other people in your home. Also, you should use a separate bathroom, if available. If you need to be around other people in or outside of the home, wear a mask. 9. Avoid sharing personal items with other people in your household, like dishes, towels, and bedding. 10. Clean all surfaces that are touched often, like counters, tabletops, and doorknobs. Use household cleaning sprays or wipes according to the label instructions. cdc.gov/coronavirus 01/11/2019 This information is not intended to replace advice given to you by your health care provider. Make sure you discuss any questions you have with your health care provider. Document Revised: 06/15/2019 Document Reviewed: 06/15/2019 Elsevier Patient Education  2020 [employer] Inc[employer]  

## 2020-05-13 DIAGNOSIS — Z419 Encounter for procedure for purposes other than remedying health state, unspecified: Secondary | ICD-10-CM | POA: Diagnosis not present

## 2020-06-12 DIAGNOSIS — Z419 Encounter for procedure for purposes other than remedying health state, unspecified: Secondary | ICD-10-CM | POA: Diagnosis not present

## 2020-07-10 IMAGING — US US MFM OB DETAIL+14 WK
1 series · 13 of 28 positions shown · non-contrast
Comparison: none

[Series 1: us mfm ob detail+14 wk · 105 acquisitions, 13 frames shown]
[im 4/105]
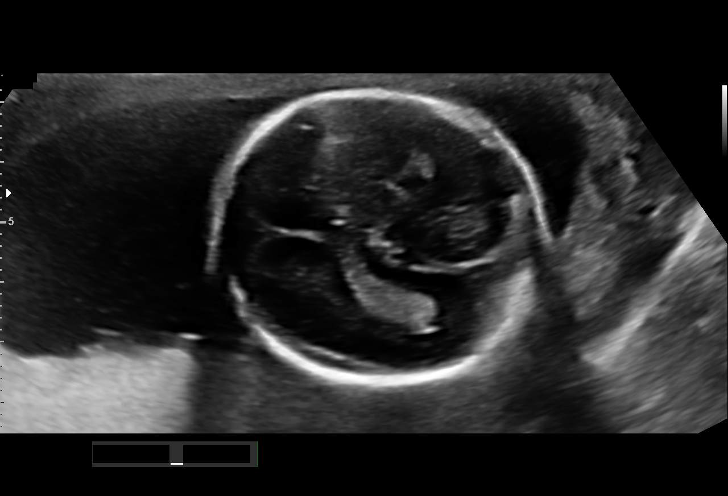
[im 12/105]
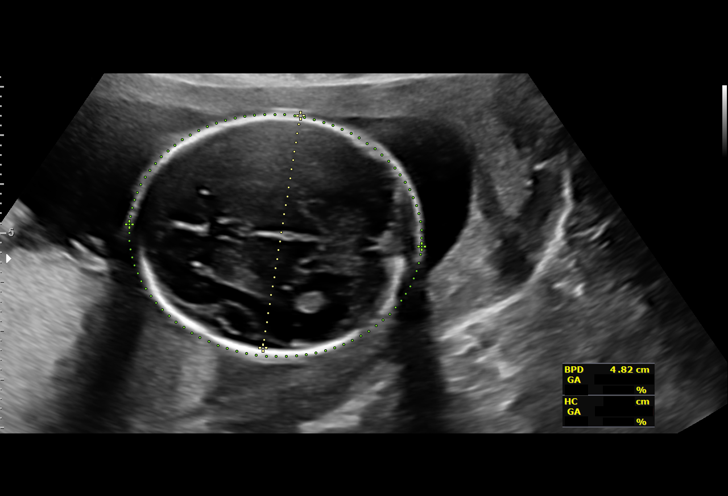
[im 20/105]
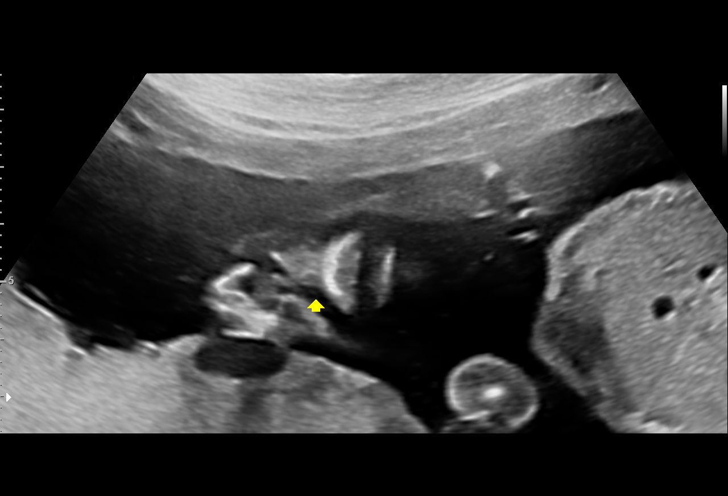
[im 27/105]
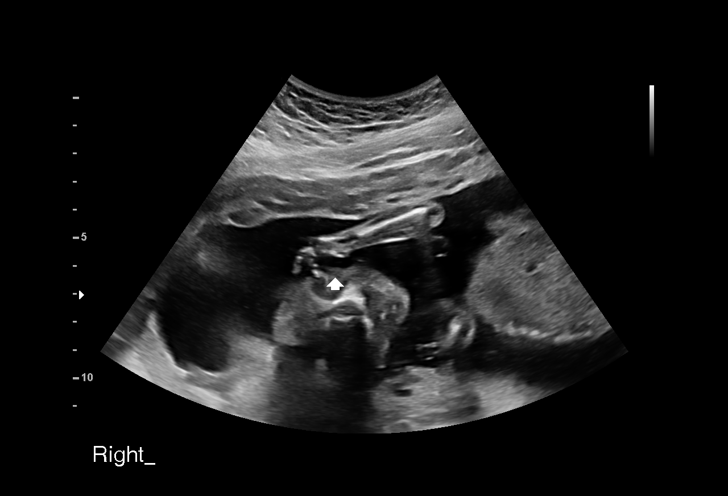
[im 35/105]
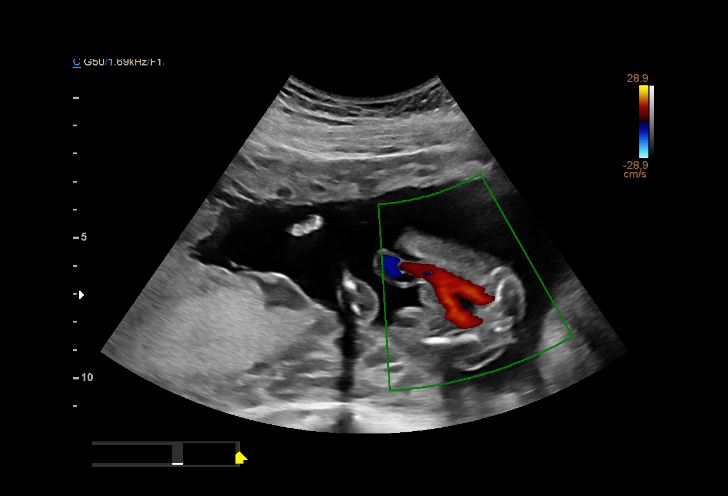
[im 43/105]
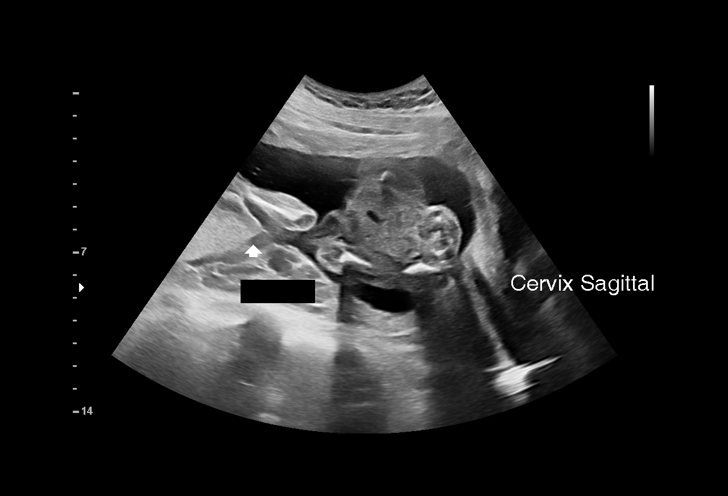
[im 54/105]
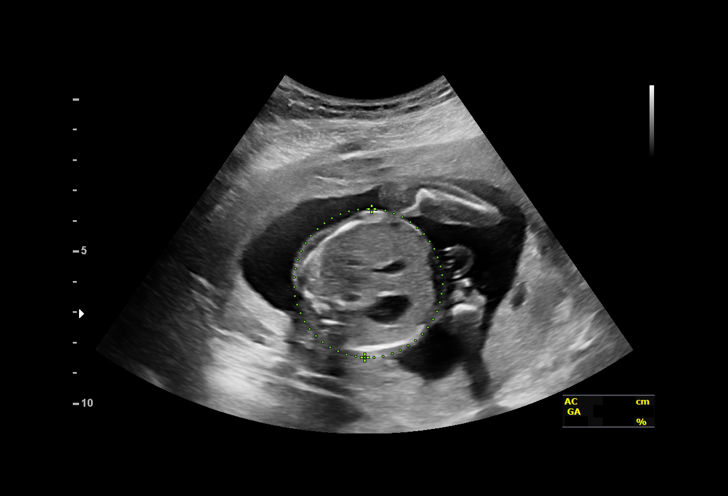
[im 62/105]
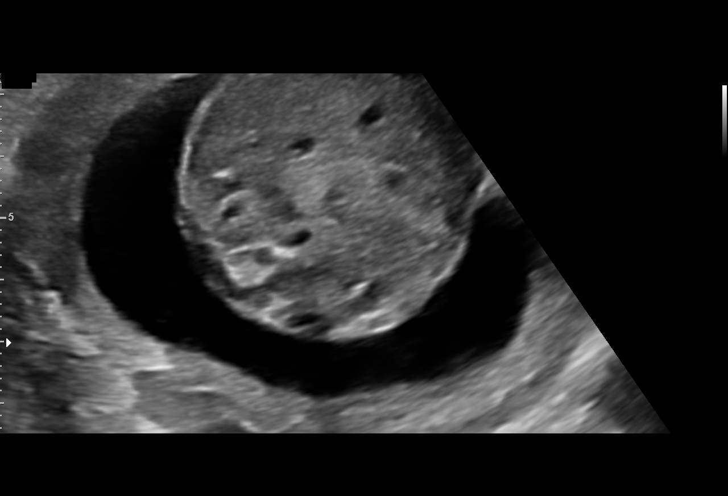
[im 70/105]
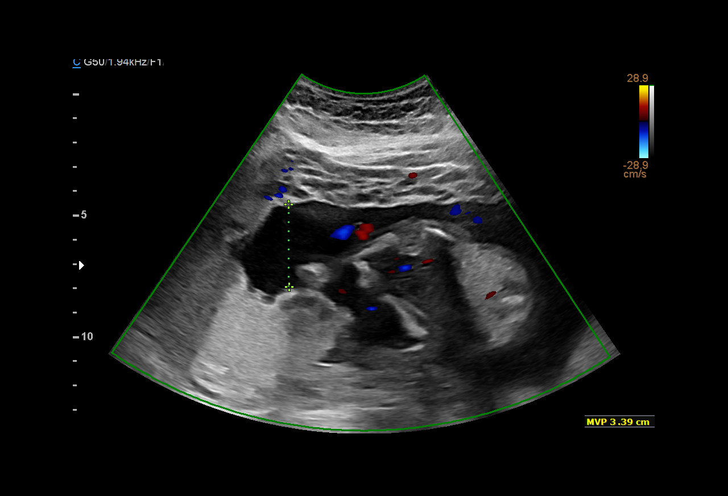
[im 78/105]
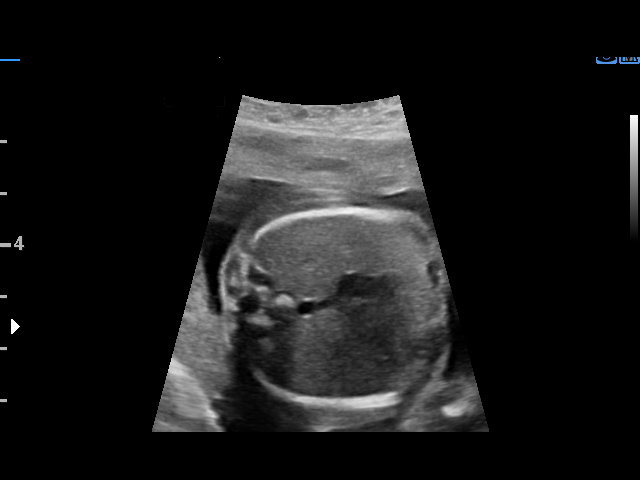
[im 85/105]
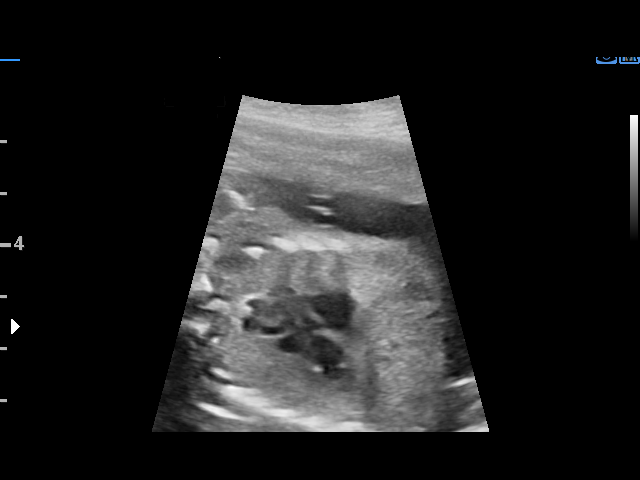
[im 93/105]
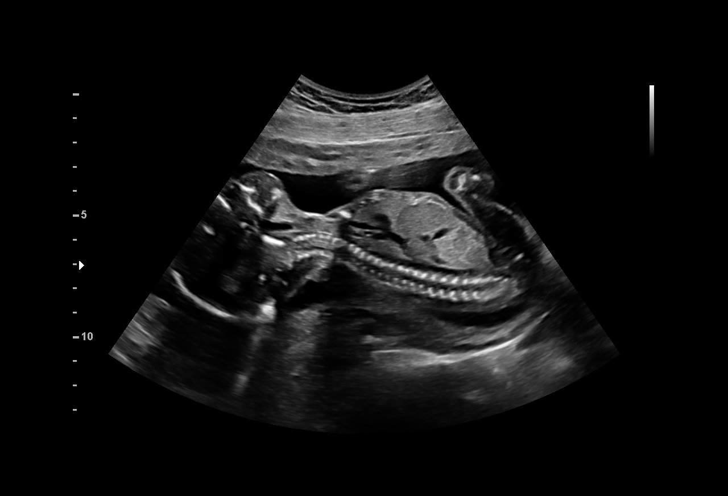
[im 101/105]
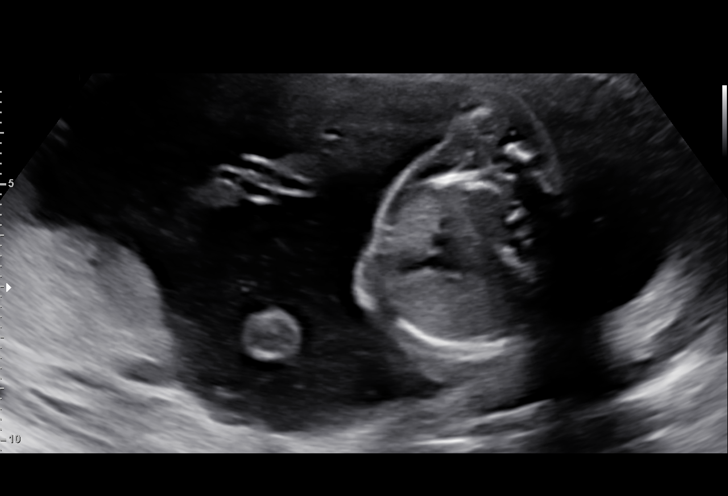

[13 of 28 positions shown; findings below may reference images not displayed]

----------------------------------------------------------------------

 ----------------------------------------------------------------------
Indications

  Fetal abnormality - other known or
  suspected (EIFLV)
  Encounter for antenatal screening for
  malformations
  19 weeks gestation of pregnancy (low risk
  NIPS)
 ----------------------------------------------------------------------
Fetal Evaluation

 Num Of Fetuses:         1
 Fetal Heart Rate(bpm):  145
 Cardiac Activity:       Observed
 Presentation:           Breech
 Placenta:               Posterior
 P. Cord Insertion:      Visualized

 Amniotic Fluid
 AFI FV:      Within normal limits

                             Largest Pocket(cm)

Biometry

 BPD:      47.5  mm     G. Age:  20w 3d         91  %    CI:        76.47   %    70 - 86
                                                         FL/HC:      17.4   %    16.1 -
 HC:      172.1  mm     G. Age:  19w 5d         73  %    HC/AC:      1.15        1.09 -
 AC:      150.3  mm     G. Age:  20w 2d         81  %    FL/BPD:     62.9   %
 FL:       29.9  mm     G. Age:  19w 2d         46  %    FL/AC:      19.9   %    20 - 24
 HUM:      27.5  mm     G. Age:  18w 6d         41  %
 CER:      20.2  mm     G. Age:  19w 1d         51  %
 NFT:       4.2  mm
 LV:        7.5  mm
 CM:        3.8  mm

 Est. FW:     314  gm    0 lb 11 oz      83  %
OB History

 Gravidity:    2          SAB:   1
Gestational Age

 LMP:           19w 1d        Date:  05/28/19                 EDD:   03/03/20
 U/S Today:     20w 0d                                        EDD:   02/26/20
 Best:          19w 1d     Det. By:  LMP  (05/28/19)          EDD:   03/03/20
Anatomy

 Cranium:               Appears normal         LVOT:                   Appears normal
 Cavum:                 Appears normal         Aortic Arch:            Appears normal
 Ventricles:            Appears normal         Ductal Arch:            Appears normal
 Choroid Plexus:        Appears normal         Diaphragm:              Appears normal
 Cerebellum:            Appears normal         Stomach:                Appears normal, left
                                                                       sided
 Posterior Fossa:       Appears normal         Abdomen:                Appears normal
 Nuchal Fold:           Appears normal         Abdominal Wall:         Appears nml (cord
                                                                       insert, abd wall)
 Face:                  Appears normal         Cord Vessels:           Appears normal (3
                        (orbits and profile)                           vessel cord)
 Lips:                  Appears normal         Kidneys:                Appear normal
 Palate:                Not well visualized    Bladder:                Appears normal
 Thoracic:              Appears normal         Spine:                  Not well visualized
 Heart:                 Echogenic focus        Upper Extremities:      Appears normal
                        in LV
 RVOT:                  Appears normal         Lower Extremities:      Appears normal

 Other:  Heels visualized. Open hands visualized. Nasal bone visualized.
         Technically difficult due to fetal position.
Cervix Uterus Adnexa

 Cervix
 Length:           4.06  cm.
 Normal appearance by transabdominal scan.

 Uterus
 No abnormality visualized.

 Left Ovary
 Size(cm)       3.2  x   1.9    x  1.9       Vol(ml):
 Within normal limits.

 Right Ovary
 Size(cm)       2.5  x   1.4    x  1.7       Vol(ml):
 Within normal limits.
Comments

 This patient was seen for a detailed fetal anatomy scan. She
 denies any significant past medical history and denies any
 problems in her current pregnancy.
 She had a cell free DNA test earlier in her pregnancy which
 indicated a low risk for trisomy 21, 18, and 13. A male fetus is
 predicted.
 She was informed that the fetal growth and amniotic fluid
 level were appropriate for her gestational age.
 On today's exam, an intracardiac echogenic focus was noted
 in the left ventricle of the fetal heart.  The small association
 between an echogenic focus and Down syndrome was
 discussed. Due to the echogenic focus noted today, the
 patient was offered and declined an amniocentesis today for
 definitive diagnosis of fetal aneuploidy.  She reports that she
 is comfortable with her negative cell free DNA test.
 The patient was informed that anomalies may be missed due
 to technical limitations. If the fetus is in a suboptimal position
 or maternal habitus is increased, visualization of the fetus in
 the maternal uterus may be impaired.
 The views of the fetal spine were unable to be fully visualized
 today due to the fetal position.
 A follow-up exam was scheduled in 4 weeks to complete the
 views of the fetal spine and to assess the fetal growth.

## 2020-07-13 DIAGNOSIS — Z419 Encounter for procedure for purposes other than remedying health state, unspecified: Secondary | ICD-10-CM | POA: Diagnosis not present

## 2020-08-13 DIAGNOSIS — Z419 Encounter for procedure for purposes other than remedying health state, unspecified: Secondary | ICD-10-CM | POA: Diagnosis not present

## 2020-09-10 DIAGNOSIS — Z419 Encounter for procedure for purposes other than remedying health state, unspecified: Secondary | ICD-10-CM | POA: Diagnosis not present

## 2020-10-11 DIAGNOSIS — Z419 Encounter for procedure for purposes other than remedying health state, unspecified: Secondary | ICD-10-CM | POA: Diagnosis not present

## 2020-11-10 DIAGNOSIS — Z419 Encounter for procedure for purposes other than remedying health state, unspecified: Secondary | ICD-10-CM | POA: Diagnosis not present

## 2020-12-11 DIAGNOSIS — Z419 Encounter for procedure for purposes other than remedying health state, unspecified: Secondary | ICD-10-CM | POA: Diagnosis not present

## 2021-01-10 DIAGNOSIS — Z419 Encounter for procedure for purposes other than remedying health state, unspecified: Secondary | ICD-10-CM | POA: Diagnosis not present

## 2021-02-10 DIAGNOSIS — Z419 Encounter for procedure for purposes other than remedying health state, unspecified: Secondary | ICD-10-CM | POA: Diagnosis not present

## 2021-03-13 DIAGNOSIS — Z419 Encounter for procedure for purposes other than remedying health state, unspecified: Secondary | ICD-10-CM | POA: Diagnosis not present

## 2021-04-12 DIAGNOSIS — Z419 Encounter for procedure for purposes other than remedying health state, unspecified: Secondary | ICD-10-CM | POA: Diagnosis not present

## 2021-05-13 DIAGNOSIS — Z419 Encounter for procedure for purposes other than remedying health state, unspecified: Secondary | ICD-10-CM | POA: Diagnosis not present

## 2021-06-12 DIAGNOSIS — Z419 Encounter for procedure for purposes other than remedying health state, unspecified: Secondary | ICD-10-CM | POA: Diagnosis not present

## 2021-06-20 ENCOUNTER — Other Ambulatory Visit: Payer: Self-pay

## 2021-06-20 ENCOUNTER — Encounter: Payer: Self-pay | Admitting: Physician Assistant

## 2021-06-20 ENCOUNTER — Ambulatory Visit: Payer: Medicaid Other | Admitting: Physician Assistant

## 2021-06-20 VITALS — BP 120/70 | HR 95 | Temp 97.2°F | Ht 65.0 in | Wt 185.4 lb

## 2021-06-20 DIAGNOSIS — M545 Low back pain, unspecified: Secondary | ICD-10-CM | POA: Diagnosis not present

## 2021-06-20 DIAGNOSIS — F320 Major depressive disorder, single episode, mild: Secondary | ICD-10-CM | POA: Diagnosis not present

## 2021-06-20 DIAGNOSIS — K429 Umbilical hernia without obstruction or gangrene: Secondary | ICD-10-CM | POA: Diagnosis not present

## 2021-06-20 DIAGNOSIS — R5381 Other malaise: Secondary | ICD-10-CM

## 2021-06-20 MED ORDER — SERTRALINE HCL 50 MG PO TABS: 50.0000 mg | ORAL_TABLET | Freq: Every day | ORAL | 1 refills | Status: DC

## 2021-06-20 NOTE — Progress Notes (Signed)
Subjective:  Patient ID: Elizabeth Singleton, female    DOB: 1991/02/21  Age: 30 y.o. 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 30 tablet 0   No current facility-administered medications on file prior to visit.   History reviewed. No pertinent past medical history. Past Surgical History:  Procedure Laterality Date   TONSILLECTOMY     TONSILLECTOMY      Family History  Problem Relation Age of Onset   Hypertension Father    Hypertension Paternal Grandmother    Diabetes Neg Hx    Cancer Neg Hx    Social History   Socioeconomic History   Marital status: Married    Spouse name: Not on file   Number of children: Not on file   Years of education: Not on file   Highest education level: Not on file  Occupational History   Not on file  Tobacco Use   Smoking status: Never   Smokeless tobacco: Never  Vaping Use   Vaping Use: Never used  Substance and Sexual Activity   Alcohol use: Never   Drug use: Never   Sexual activity: Yes    Birth control/protection: None  Other Topics Concern   Not on file  Social History Narrative   Not on file    Social Determinants of Health   Financial Resource Strain: Not on file  Food Insecurity: Not on file  Transportation Needs: Not on file  Physical Activity: Not on file  Stress: Not on file  Social Connections: Not on file    Review of Systems CONSTITUTIONAL: Negative for chills, fatigue, fever, unintentional weight gain and unintentional weight loss.  E/N/T: Negative for ear pain, nasal congestion and sore throat.  CARDIOVASCULAR: Negative for chest pain, dizziness, palpitations and pedal edema.  RESPIRATORY: Negative for recent cough and dyspnea.  GASTROINTESTINAL: see HPI MSK: see HPI INTEGUMENTARY: Negative for rash.  NEUROLOGICAL: Negative for dizziness and headaches.  PSYCHIATRIC: see HPI      Objective:  BP 120/70 (BP Location: Left Arm, Patient Position: Sitting, Cuff Size: Normal)   Pulse 95   Temp (!) 97.2 F (36.2 C) (Temporal)   Ht 5\' 5"  (1.651 m)   Wt 185 lb 6.4 oz (84.1 kg)   SpO2 98%   BMI 30.85 kg/m   BP/Weight 06/20/2021 04/24/2020 04/22/2020  Systolic BP 120 109 -  Diastolic BP 70 74 -  Wt. (Lbs) 185.4 - 180.6  BMI 30.85 - 29.15    Physical Exam PHYSICAL EXAM:   VS: BP 120/70 (BP Location: Left Arm, Patient Position: Sitting, Cuff Size: Normal)  Pulse 95   Temp (!) 97.2 F (36.2 C) (Temporal)   Ht 5\' 5"  (1.651 m)   Wt 185 lb 6.4 oz (84.1 kg)   SpO2 98%   BMI 30.85 kg/m   GEN: Well nourished, well developed, in no acute distress  Cardiac: RRR; no murmurs, rubs, or gallops,no edema -  Respiratory:  normal respiratory rate and pattern with no distress - normal breath sounds with no rales, rhonchi, wheezes or rubs GI: normal bowel sounds, - nonincarcerated umbilical hernia noted MS: no deformity or atrophy  Skin: warm and dry, no rash  Psych: euthymic mood, appropriate affect and demeanor  Diabetic Foot Exam - Simple   No data filed      Lab Results  Component Value Date   WBC 19.4 (H) 01/20/2020   HGB 10.4 (L) 01/20/2020   HCT  32.1 (L) 01/20/2020   PLT 317 01/20/2020      Assessment & Plan:   Problem List Items Addressed This Visit   None Visit Diagnoses     Malaise    -  Primary   Relevant Orders   TSH   Depression, major, single episode, mild (HCC)       Relevant Medications   sertraline (ZOLOFT) 50 MG tablet   Umbilical hernia without obstruction and without gangrene       Relevant Orders   Ambulatory referral to General Surgery  Low back pain Rom exercises handout given     .  Meds ordered this encounter  Medications   sertraline (ZOLOFT) 50 MG tablet    Sig: Take 1 tablet (50 mg total) by mouth daily.    Dispense:  30 tablet    Refill:  1    Order Specific Question:   Supervising Provider    Answer09/04/2020 [phone]    Orders Placed This Encounter  Procedures   TSH   Ambulatory referral to General Surgery     Follow-up: Return in about 4 weeks (around 07/18/2021) for follow up.  An After Visit Summary was printed and given to the patient.  09/15/2021 [facility] [phone]

## 2021-06-21 LAB — TSH: TSH: 1.1 u[IU]/mL (ref 0.450–4.500)

## 2021-07-13 DIAGNOSIS — Z419 Encounter for procedure for purposes other than remedying health state, unspecified: Secondary | ICD-10-CM | POA: Diagnosis not present

## 2021-07-16 ENCOUNTER — Other Ambulatory Visit: Payer: Self-pay | Admitting: Physician Assistant

## 2021-07-16 DIAGNOSIS — F320 Major depressive disorder, single episode, mild: Secondary | ICD-10-CM

## 2021-07-22 ENCOUNTER — Encounter: Payer: Self-pay | Admitting: Physician Assistant

## 2021-07-22 ENCOUNTER — Other Ambulatory Visit: Payer: Self-pay

## 2021-07-22 ENCOUNTER — Ambulatory Visit (INDEPENDENT_AMBULATORY_CARE_PROVIDER_SITE_OTHER): Payer: Medicaid Other | Admitting: Physician Assistant

## 2021-07-22 VITALS — BP 110/80 | HR 95 | Temp 97.5°F | Ht 65.0 in | Wt 185.0 lb

## 2021-07-22 DIAGNOSIS — F320 Major depressive disorder, single episode, mild: Secondary | ICD-10-CM

## 2021-07-22 DIAGNOSIS — K429 Umbilical hernia without obstruction or gangrene: Secondary | ICD-10-CM | POA: Diagnosis not present

## 2021-07-22 MED ORDER — SERTRALINE HCL 100 MG PO TABS: 100.0000 mg | ORAL_TABLET | Freq: Every day | ORAL | 3 refills | Status: DC

## 2021-07-22 NOTE — Progress Notes (Signed)
Acute Office Visit  Subjective:    Patient ID: Elizabeth Singleton, female    DOB: Jan 17, 1991, 31 y.o.   MRN: [id number]  Chief Complaint  Patient presents with   Depression    4wk follow up    HPI: Patient is in today for follow up of depression - states she is doing better since starting zoloft - does feel like she could benefit from increased dose however a lot of days good for her and her sleep is improved -   Pt will be getting umbilical hernia surgery on Friday  History reviewed. No pertinent past medical history.  Past Surgical History:  Procedure Laterality Date   TONSILLECTOMY     TONSILLECTOMY      Family History  Problem Relation Age of Onset   Hypertension Father    Hypertension Paternal Grandmother    Diabetes Neg Hx    Cancer Neg Hx     Social History   Socioeconomic History   Marital status: Married    Spouse name: Not on file   Number of children: Not on file   Years of education: Not on file   Highest education level: Not on file  Occupational History   Not on file  Tobacco Use   Smoking status: Never   Smokeless tobacco: Never  Vaping Use   Vaping Use: Never used  Substance and Sexual Activity   Alcohol use: Never   Drug use: Never   Sexual activity: Yes    Birth control/protection: None  Other Topics Concern   Not on file  Social History Narrative   Not on file   Social Determinants of Health   Financial Resource Strain: Not on file  Food Insecurity: Not on file  Transportation Needs: Not on file  Physical Activity: Not on file  Stress: Not on file  Social Connections: Not on file  Intimate Partner Violence: Not on file    Outpatient Medications Prior to Visit  Medication Sig Dispense Refill   ibuprofen (ADVIL) 600 MG tablet Take 1 tablet (600 mg total) by mouth every 6 (six) hours. 30 tablet 0   sertraline (ZOLOFT) 50 MG tablet TAKE 1 TABLET BY MOUTH EVERY DAY 90 tablet 0   No facility-administered medications prior to visit.     No Known Allergies  Review of Systems CONSTITUTIONAL: Negative for chills, fatigue, fever, unintentional weight gain and unintentional weight loss.  CARDIOVASCULAR: Negative for chest pain, dizziness, palpitations and pedal edema.  RESPIRATORY: Negative for recent cough and dyspnea.  PSYCHIATRIC: see HPI     Objective:    Physical Exam PHYSICAL EXAM:   VS: BP 110/80 (BP Location: Left Arm, Patient Position: Sitting, Cuff Size: Normal)    Pulse 95    Temp (!) 97.5 F (36.4 C) (Temporal)    Ht 5' 5"  (1.651 m)    Wt 185 lb (83.9 kg)    SpO2 98%    BMI 30.79 kg/m   GEN: Well nourished, well developed, in no acute distress  Cardiac: RRR; no murmurs, rubs, or gallops,no edema -  Respiratory:  normal respiratory rate and pattern with no distress - normal breath sounds with no rales, rhonchi, wheezes or rubs Psych: euthymic mood, appropriate affect and demeanor  BP 110/80 (BP Location: Left Arm, Patient Position: Sitting, Cuff Size: Normal)    Pulse 95    Temp (!) 97.5 F (36.4 C) (Temporal)    Ht 5' 5"  (1.651 m)    Wt 185 lb (83.9 kg)  SpO2 98%    BMI 30.79 kg/m  Wt Readings from Last 3 Encounters:  07/22/21 185 lb (83.9 kg)  06/20/21 185 lb 6.4 oz (84.1 kg)  04/22/20 180 lb 9.6 oz (81.9 kg)    Health Maintenance Due  Topic Date Due   COVID-19 Vaccine (1) Never done   Hepatitis C Screening  Never done   INFLUENZA VACCINE  02/10/2021    There are no preventive care reminders to display for this patient.   Lab Results  Component Value Date   TSH 1.100 06/20/2021   Lab Results  Component Value Date   WBC 19.4 (H) 01/20/2020   HGB 10.4 (L) 01/20/2020   HCT 32.1 (L) 01/20/2020   MCV 83.8 01/20/2020   PLT 317 01/20/2020   No results found for: NA, K, CHLORIDE, CO2, GLUCOSE, BUN, CREATININE, BILITOT, ALKPHOS, AST, ALT, PROT, ALBUMIN, CALCIUM, ANIONGAP, EGFR, GFR No results found for: CHOL No results found for: HDL No results found for: LDLCALC No results found for:  TRIG No results found for: CHOLHDL No results found for: HGBA1C     Assessment & Plan:   Problem List Items Addressed This Visit       Other   Depression, major, single episode, mild (HCC)   Relevant Medications   sertraline (ZOLOFT) 100 MG tablet   Umbilical hernia without obstruction and without gangrene - Primary Follow up with surgeon as directed   Meds ordered this encounter  Medications   sertraline (ZOLOFT) 100 MG tablet    Sig: Take 1 tablet (100 mg total) by mouth daily.    Dispense:  30 tablet    Refill:  3    Order Specific Question:   Supervising Provider    Answer:   Shelton Silvas    No orders of the defined types were placed in this encounter.    Follow-up: Return in about 4 months (around 11/19/2021) for follow up.  An After Visit Summary was printed and given to the patient.  Yetta Flock [facility] [phone]

## 2021-07-25 DIAGNOSIS — K429 Umbilical hernia without obstruction or gangrene: Secondary | ICD-10-CM | POA: Diagnosis not present

## 2021-07-29 DIAGNOSIS — K429 Umbilical hernia without obstruction or gangrene: Secondary | ICD-10-CM | POA: Diagnosis not present

## 2021-08-13 DIAGNOSIS — Z419 Encounter for procedure for purposes other than remedying health state, unspecified: Secondary | ICD-10-CM | POA: Diagnosis not present

## 2021-08-15 ENCOUNTER — Ambulatory Visit (INDEPENDENT_AMBULATORY_CARE_PROVIDER_SITE_OTHER): Payer: Medicaid Other | Admitting: Physician Assistant

## 2021-08-15 ENCOUNTER — Other Ambulatory Visit: Payer: Self-pay

## 2021-08-15 ENCOUNTER — Encounter: Payer: Self-pay | Admitting: Physician Assistant

## 2021-08-15 VITALS — BP 126/78 | HR 90 | Temp 97.8°F | Ht 65.0 in | Wt 182.2 lb

## 2021-08-15 DIAGNOSIS — M79672 Pain in left foot: Secondary | ICD-10-CM | POA: Diagnosis not present

## 2021-08-15 MED ORDER — MELOXICAM 7.5 MG PO TABS: 7.5000 mg | ORAL_TABLET | Freq: Every day | ORAL | 0 refills | Status: DC

## 2021-08-15 NOTE — Progress Notes (Signed)
Acute Office Visit  Subjective:    Patient ID: Elizabeth Singleton, female    DOB: [birth year], 31 y.o.   MRN: [id number]  Chief Complaint  Patient presents with   Foot Injury    HPI: Patient is in today for left heel pain - pt states that she has had pain intermittently for the past 6 months - cannot recall specific injury or trauma Hurts worse when first getting up in morning and walking She does wear crocs alot  History reviewed. No pertinent past medical history.  Past Surgical History:  Procedure Laterality Date   TONSILLECTOMY     TONSILLECTOMY      Family History  Problem Relation Age of Onset   Hypertension Father    Hypertension Paternal Grandmother    Diabetes Neg Hx    Cancer Neg Hx     Social History   Socioeconomic History   Marital status: Married    Spouse name: Not on file   Number of children: Not on file   Years of education: Not on file   Highest education level: Not on file  Occupational History   Not on file  Tobacco Use   Smoking status: Never   Smokeless tobacco: Never  Vaping Use   Vaping Use: Never used  Substance and Sexual Activity   Alcohol use: Never   Drug use: Never   Sexual activity: Yes    Birth control/protection: None  Other Topics Concern   Not on file  Social History Narrative   Not on file   Social Determinants of Health   Financial Resource Strain: Not on file  Food Insecurity: Not on file  Transportation Needs: Not on file  Physical Activity: Not on file  Stress: Not on file  Social Connections: Not on file  Intimate Partner Violence: Not on file    Outpatient Medications Prior to Visit  Medication Sig Dispense Refill   sertraline (ZOLOFT) 100 MG tablet Take 1 tablet (100 mg total) by mouth daily. 30 tablet 3   ibuprofen (ADVIL) 600 MG tablet Take 1 tablet (600 mg total) by mouth every 6 (six) hours. 30 tablet 0   No facility-administered medications prior to visit.    No Known Allergies  Review of  Systems CONSTITUTIONAL: Negative for chills, fatigue, fever, unintentional weight gain and unintentional weight loss.  CARDIOVASCULAR: Negative for chest pain, dizziness, palpitations and pedal edema.  RESPIRATORY: Negative for recent cough and dyspnea.  MSK: see HPI         Objective:    PHYSICAL EXAM:   VS: BP 126/78    Pulse 90    Temp 97.8 F (36.6 C)    Ht 5' 5"  (1.651 m)    Wt 182 lb 3.7 oz (82.7 kg)    SpO2 99%    BMI 30.32 kg/m   GEN: Well nourished, well developed, in no acute distress  Cardiac: RRR; no murmurs, rubs, or gallops,no edema -  Respiratory:  normal respiratory rate and pattern with no distress - normal breath sounds with no rales, rhonchi, wheezes or rubs MS: no deformity or atrophy - tender left heel to palpation Skin: warm and dry, no rash      Health Maintenance Due  Topic Date Due   COVID-19 Vaccine (1) Never done   Hepatitis C Screening  Never done   INFLUENZA VACCINE  02/10/2021    There are no preventive care reminders to display for this patient.   Lab Results  Component Value Date  TSH 1.100 06/20/2021   Lab Results  Component Value Date   WBC 19.4 (H) 01/20/2020   HGB 10.4 (L) 01/20/2020   HCT 32.1 (L) 01/20/2020   MCV 83.8 01/20/2020   PLT 317 01/20/2020   No results found for: NA, K, CHLORIDE, CO2, GLUCOSE, BUN, CREATININE, BILITOT, ALKPHOS, AST, ALT, PROT, ALBUMIN, CALCIUM, ANIONGAP, EGFR, GFR No results found for: CHOL No results found for: HDL No results found for: LDLCALC No results found for: TRIG No results found for: CHOLHDL No results found for: HGBA1C     Assessment & Plan:   Problem List Items Addressed This Visit   None Visit Diagnoses     Pain of left heel    -  Primary   Relevant Medications   meloxicam (MOBIC) 7.5 MG tablet Ice therapy Stretching exercises Heel cup Wear more supportive shoes      Meds ordered this encounter  Medications   meloxicam (MOBIC) 7.5 MG tablet    Sig: Take 1 tablet  (7.5 mg total) by mouth daily.    Dispense:  30 tablet    Refill:  0    Order Specific Question:   Supervising Provider    Answer:   Shelton Silvas    No orders of the defined types were placed in this encounter.    Follow-up: Return if symptoms worsen or fail to improve.  An After Visit Summary was printed and given to the patient.  Yetta Flock [facility] [phone]

## 2021-09-10 DIAGNOSIS — Z419 Encounter for procedure for purposes other than remedying health state, unspecified: Secondary | ICD-10-CM | POA: Diagnosis not present

## 2021-09-22 ENCOUNTER — Other Ambulatory Visit: Payer: Self-pay | Admitting: Physician Assistant

## 2021-09-22 DIAGNOSIS — M79672 Pain in left foot: Secondary | ICD-10-CM

## 2021-10-10 ENCOUNTER — Other Ambulatory Visit: Payer: Self-pay | Admitting: Physician Assistant

## 2021-10-10 DIAGNOSIS — F320 Major depressive disorder, single episode, mild: Secondary | ICD-10-CM

## 2021-10-11 DIAGNOSIS — Z419 Encounter for procedure for purposes other than remedying health state, unspecified: Secondary | ICD-10-CM | POA: Diagnosis not present

## 2021-10-29 ENCOUNTER — Other Ambulatory Visit: Payer: Self-pay | Admitting: Physician Assistant

## 2021-10-29 DIAGNOSIS — M79672 Pain in left foot: Secondary | ICD-10-CM

## 2021-11-10 DIAGNOSIS — Z419 Encounter for procedure for purposes other than remedying health state, unspecified: Secondary | ICD-10-CM | POA: Diagnosis not present

## 2021-11-21 ENCOUNTER — Ambulatory Visit: Payer: Medicaid Other | Admitting: Physician Assistant

## 2021-11-25 ENCOUNTER — Ambulatory Visit (INDEPENDENT_AMBULATORY_CARE_PROVIDER_SITE_OTHER): Payer: Medicaid Other | Admitting: Physician Assistant

## 2021-11-25 ENCOUNTER — Encounter: Payer: Self-pay | Admitting: Physician Assistant

## 2021-11-25 VITALS — BP 128/72 | HR 74 | Temp 97.7°F | Ht 65.0 in | Wt 189.0 lb

## 2021-11-25 DIAGNOSIS — M79672 Pain in left foot: Secondary | ICD-10-CM | POA: Diagnosis not present

## 2021-11-25 DIAGNOSIS — F320 Major depressive disorder, single episode, mild: Secondary | ICD-10-CM | POA: Diagnosis not present

## 2021-11-25 MED ORDER — SERTRALINE HCL 100 MG PO TABS: 100.0000 mg | ORAL_TABLET | Freq: Every day | ORAL | 1 refills | Status: DC

## 2021-11-25 MED ORDER — MELOXICAM 7.5 MG PO TABS: 7.5000 mg | ORAL_TABLET | Freq: Every day | ORAL | 1 refills | Status: DC

## 2021-11-25 NOTE — Progress Notes (Signed)
? ?Subjective:  ?Patient ID: Elizabeth Singleton, female    DOB: January 14, 1991  Age: 31 y.o. MRN: [id number] ? ?Chief Complaint  ?Patient presents with  ? Depression  ? ? ?HPI ? Pt in for follow up of foot pain - she states she is doing well on meloxicam and symptoms have almost resolved.  Has some discomfort when sitting but no pain with standing or walking ? ?Pt with history of depression .  Is currently on zoloft 100mg  qd - states is doing well on the medication and voices no concerns or problems ?No current outpatient medications on file prior to visit.  ? ?No current facility-administered medications on file prior to visit.  ? ?History reviewed. No pertinent past medical history. ?Past Surgical History:  ?Procedure Laterality Date  ? TONSILLECTOMY    ? TONSILLECTOMY    ?  ?Family History  ?Problem Relation Age of Onset  ? Hypertension Father   ? Hypertension Paternal Grandmother   ? Diabetes Neg Hx   ? Cancer Neg Hx   ? ?Social History  ? ?Socioeconomic History  ? Marital status: Married  ?  Spouse name: Not on file  ? Number of children: Not on file  ? Years of education: Not on file  ? Highest education level: Not on file  ?Occupational History  ? Not on file  ?Tobacco Use  ? Smoking status: Never  ? Smokeless tobacco: Never  ?Vaping Use  ? Vaping Use: Never used  ?Substance and Sexual Activity  ? Alcohol use: Never  ? Drug use: Never  ? Sexual activity: Yes  ?  Birth control/protection: None  ?Other Topics Concern  ? Not on file  ?Social History Narrative  ? Not on file  ? ?Social Determinants of Health  ? ?Financial Resource Strain: Not on file  ?Food Insecurity: Not on file  ?Transportation Needs: Not on file  ?Physical Activity: Not on file  ?Stress: Not on file  ?Social Connections: Not on file  ? ? ?Review of Systems ?CONSTITUTIONAL: Negative for chills, fatigue, fever, unintentional weight gain and unintentional weight loss.  ?CARDIOVASCULAR: Negative for chest pain,  ?RESPIRATORY: Negative for recent cough and  dyspnea.  ?PSYCHIATRIC: Negative for sleep disturbance and to question depression screen.  Negative for depression, negative for anhedonia.  ?   ? ? ?Objective:  ?PHYSICAL EXAM:  ? ?VS: BP 128/72 (BP Location: Left Arm, Patient Position: Sitting)   Pulse 74   Temp 97.7 ?F (36.5 ?C) (Temporal)   Ht 5\' 5"  (1.651 m)   Wt 189 lb (85.7 kg)   SpO2 98%   BMI 31.45 kg/m?  ? ?GEN: Well nourished, well developed, in no acute distress  ?Cardiac: RRR; no murmurs,  ?Respiratory:  normal respiratory rate and pattern with no distress - normal breath sounds with no rales, rhonchi, wheezes or rubs ?Psych: euthymic mood, appropriate affect and demeanor ? ? ?Lab Results  ?Component Value Date  ? WBC 19.4 (H) 01/20/2020  ? HGB 10.4 (L) 01/20/2020  ? HCT 32.1 (L) 01/20/2020  ? PLT 317 01/20/2020  ? TSH 1.100 06/20/2021  ? ? ? ? ?Assessment & Plan:  ? ?Problem List Items Addressed This Visit   ? ?  ? Other  ? Depression, major, single episode, mild (HCC)  ? Relevant Medications  ? sertraline (ZOLOFT) 100 MG tablet  ? Pain of left heel - Primary  ? Relevant Medications  ? meloxicam (MOBIC) 7.5 MG tablet  ?. ? ?Meds ordered this encounter  ?Medications  ?  meloxicam (MOBIC) 7.5 MG tablet  ?  Sig: Take 1 tablet (7.5 mg total) by mouth daily.  ?  Dispense:  90 tablet  ?  Refill:  1  ?  Order Specific Question:   Supervising Provider  ?  AnswerBlane Ohara [[id number]]  ? sertraline (ZOLOFT) 100 MG tablet  ?  Sig: Take 1 tablet (100 mg total) by mouth daily.  ?  Dispense:  90 tablet  ?  Refill:  1  ?  Order Specific Question:   Supervising Provider  ?  AnswerBlane Ohara [[id number]]  ? ? ?No orders of the defined types were placed in this encounter. ?  ? ?Follow-up: Return in about 6 months (around 05/28/2022) for chronic follow up. ? ?An After Visit Summary was printed and given to the patient. ? ?SARA R Karrisa Didio, PA-C ?[facility] ?[phone] ?

## 2021-12-11 DIAGNOSIS — Z419 Encounter for procedure for purposes other than remedying health state, unspecified: Secondary | ICD-10-CM | POA: Diagnosis not present

## 2022-01-10 DIAGNOSIS — Z419 Encounter for procedure for purposes other than remedying health state, unspecified: Secondary | ICD-10-CM | POA: Diagnosis not present

## 2022-02-10 DIAGNOSIS — Z419 Encounter for procedure for purposes other than remedying health state, unspecified: Secondary | ICD-10-CM | POA: Diagnosis not present

## 2022-03-12 ENCOUNTER — Encounter: Payer: Self-pay | Admitting: Nurse Practitioner

## 2022-03-12 ENCOUNTER — Ambulatory Visit (INDEPENDENT_AMBULATORY_CARE_PROVIDER_SITE_OTHER): Payer: Medicaid Other | Admitting: Nurse Practitioner

## 2022-03-12 VITALS — BP 122/70 | HR 86 | Temp 97.4°F | Ht 66.0 in | Wt 181.0 lb

## 2022-03-12 DIAGNOSIS — Z0000 Encounter for general adult medical examination without abnormal findings: Secondary | ICD-10-CM | POA: Diagnosis not present

## 2022-03-12 NOTE — Progress Notes (Signed)
Subjective:  Patient ID: Elizabeth Singleton, female    DOB: [birth year]  Age: 31 y.o. MRN: [id number]  Chief Complaint  Patient presents with  . CPE    HPI Encounter for general adult medical examination without abnormal findings  Physical ("At Risk" items are starred): Patient's last physical exam was 1 year ago .      SDOH Screenings   Alcohol Screen: Low Risk  (03/12/2022)   Alcohol Screen   . Last Alcohol Screening Score (AUDIT): 0  Depression (PHQ2-9): Low Risk  (11/25/2021)   Depression (PHQ2-9)   . PHQ-2 Score: 0  Financial Resource Strain: Not on file  Food Insecurity: Not on file  Housing: Not on file  Physical Activity: Not on file  Social Connections: Not on file  Stress: Not on file  Tobacco Use: Low Risk  (11/25/2021)   Patient History   . Smoking Tobacco Use: Never   . Smokeless Tobacco Use: Never   . Passive Exposure: Not on file  Transportation Needs: Not on file       03/12/2022    3:51 PM 08/15/2021    9:44 AM  Fall Risk   Falls in the past year? 0 0  Number falls in past yr: 0 0  Injury with Fall? 0 0  Risk for fall due to : No Fall Risks   Follow up Falls evaluation completed        03/12/2022    3:52 PM 11/25/2021   11:14 AM 08/15/2021    9:44 AM 07/22/2021   10:56 AM  Depression screen PHQ 2/9  Decreased Interest 0 0 0 0  Down, Depressed, Hopeless 0 0 0 0  PHQ - 2 Score 0 0 0 0  Altered sleeping 0 0  0  Tired, decreased energy 0 0  0  Change in appetite 0 0  0  Feeling bad or failure about yourself  0 0  0  Trouble concentrating 0 0  0  Moving slowly or fidgety/restless 0 0  0  Suicidal thoughts 0 0  0  PHQ-9 Score 0 0  0  Difficult doing work/chores Not difficult at all Not difficult at all  Not difficult at all    Functional Status Survey: Is the patient deaf or have difficulty hearing?: No Does the patient have difficulty seeing, even when wearing glasses/contacts?: No Does the patient have difficulty concentrating, remembering, or making  decisions?: No Does the patient have difficulty walking or climbing stairs?: No Does the patient have difficulty dressing or bathing?: No Does the patient have difficulty doing errands alone such as visiting a doctor's office or shopping?: No   Safety: reviewed ;  Patient wears a seat belt. Patient's home has smoke detectors and carbon monoxide detectors. Patient practices appropriate gun safety Patient wears sunscreen with extended sun exposure. Dental Care: biannual cleanings, brushes and flosses daily. Ophthalmology/Optometry: Annual visit.  Hearing loss: none Vision impairments: Glasses Patient is not afflicted from Stress Incontinence and Urge Incontinence   Current Outpatient Medications on File Prior to Visit  Medication Sig Dispense Refill  . meloxicam (MOBIC) 7.5 MG tablet Take 1 tablet (7.5 mg total) by mouth daily. 90 tablet 1  . sertraline (ZOLOFT) 100 MG tablet Take 1 tablet (100 mg total) by mouth daily. 90 tablet 1   No current facility-administered medications on file prior to visit.    Social Hx   Social History   Socioeconomic History  . Marital status: Married    Spouse name: Not  on file  . Number of children: Not on file  . Years of education: Not on file  . Highest education level: Not on file  Occupational History  . Not on file  Tobacco Use  . Smoking status: Never  . Smokeless tobacco: Never  Vaping Use  . Vaping Use: Never used  Substance and Sexual Activity  . Alcohol use: Never  . Drug use: Never  . Sexual activity: Yes    Birth control/protection: None  Other Topics Concern  . Not on file  Social History Narrative  . Not on file   Social Determinants of Health   Financial Resource Strain: Not on file  Food Insecurity: Not on file  Transportation Needs: Not on file  Physical Activity: Not on file  Stress: Not on file  Social Connections: Not on file   No past medical history on file. Family History  Problem Relation Age of Onset   . Hypertension Father   . Hypertension Paternal Grandmother   . Diabetes Neg Hx   . Cancer Neg Hx     Review of Systems  Constitutional:  Negative for chills, fatigue and fever.  HENT:  Negative for congestion, ear pain, rhinorrhea and sore throat.   Respiratory:  Negative for cough and shortness of breath.   Cardiovascular:  Negative for chest pain.  Gastrointestinal:  Negative for abdominal pain, constipation, diarrhea, nausea and vomiting.  Genitourinary:  Negative for dysuria and urgency.  Musculoskeletal:  Negative for back pain and myalgias.  Neurological:  Negative for dizziness, weakness, light-headedness and headaches.  Psychiatric/Behavioral:  Negative for dysphoric mood. The patient is not nervous/anxious.      Objective:  BP 122/70   Pulse 86   Temp (!) 97.4 F (36.3 C)   Ht 5\' 6"  (1.676 m)   Wt 181 lb (82.1 kg)   SpO2 95%   BMI 29.21 kg/m       03/12/2022    3:44 PM 11/25/2021   11:14 AM 08/15/2021    9:43 AM  BP/Weight  Systolic BP  128 10/13/2021  Diastolic BP  72 78  Wt. (Lbs) 181 189 182.23  BMI 29.21 kg/m2 31.45 kg/m2 30.32 kg/m2    Physical Exam Vitals reviewed.  Constitutional:      Appearance: Normal appearance.  HENT:     Head: Normocephalic.     Right Ear: Tympanic membrane normal.     Left Ear: Tympanic membrane normal.     Nose: Nose normal.     Mouth/Throat:     Mouth: Mucous membranes are moist.  Eyes:     Pupils: Pupils are equal, round, and reactive to light.     Comments: Eye glasses  Cardiovascular:     Rate and Rhythm: Normal rate and regular rhythm.  Pulmonary:     Effort: Pulmonary effort is normal.     Breath sounds: Normal breath sounds.  Abdominal:     General: Bowel sounds are normal.     Palpations: Abdomen is soft.  Musculoskeletal:        General: Normal range of motion.     Cervical back: Neck supple.  Skin:    General: Skin is warm and dry.     Capillary Refill: Capillary refill takes less than 2 seconds.   Neurological:     General: No focal deficit present.     Mental Status: She is alert and oriented to person, place, and time.  Psychiatric:        Mood and Affect:  Mood normal.        Behavior: Behavior normal.    Lab Results  Component Value Date   WBC 19.4 (H) 01/20/2020   HGB 10.4 (L) 01/20/2020   HCT 32.1 (L) 01/20/2020   PLT 317 01/20/2020   TSH 1.100 06/20/2021      Assessment & Plan:    1. Annual physical exam -pt declined lab work  -pt declined flu vaccine   These are the goals we discussed:  Goals   Lose weight      This is a list of the screening recommended for you and due dates:  Health Maintenance  Topic Date Due  . Flu Shot  10/11/2022*  . Pap Smear  08/02/2022  . Tetanus Vaccine  11/29/2029  . HIV Screening  Completed  . HPV Vaccine  Aged Out  . COVID-19 Vaccine  Discontinued  *Topic was postponed. The date shown is not the original due date.      AN INDIVIDUALIZED CARE PLAN: was established or reinforced today.   SELF MANAGEMENT: The patient and I together assessed ways to personally work towards obtaining the recommended goals  Support needs The patient and/or family needs were assessed and services were offered and not necessary at this time.    Follow-up: 1-year  I, Janie Morning, NP, have reviewed all documentation for this visit. The documentation on 03/12/22 for the exam, diagnosis, procedures, and orders are all accurate and complete.    Signed, Flonnie Hailstone, DNP [facility] [phone]

## 2022-03-12 NOTE — Patient Instructions (Addendum)
Continue medications  Follow-up in 1 year    Preventive Care 8-33 Years Old, Female Preventive care refers to lifestyle choices and visits with your health care provider that can promote health and wellness. Preventive care visits are also called wellness exams. What can I expect for my preventive care visit? Counseling During your preventive care visit, your health care provider may ask about your: Medical history, including: Past medical problems. Family medical history. Pregnancy history. Current health, including: Menstrual cycle. Method of birth control. Emotional well-being. Home life and relationship well-being. Sexual activity and sexual health. Lifestyle, including: Alcohol, nicotine or tobacco, and drug use. Access to firearms. Diet, exercise, and sleep habits. Work and work Statistician. Sunscreen use. Safety issues such as seatbelt and bike helmet use. Physical exam Your health care provider may check your: Height and weight. These may be used to calculate your BMI (body mass index). BMI is a measurement that tells if you are at a healthy weight. Waist circumference. This measures the distance around your waistline. This measurement also tells if you are at a healthy weight and may help predict your risk of certain diseases, such as type 2 diabetes and high blood pressure. Heart rate and blood pressure. Body temperature. Skin for abnormal spots. What immunizations do I need?  Vaccines are usually given at various ages, according to a schedule. Your health care provider will recommend vaccines for you based on your age, medical history, and lifestyle or other factors, such as travel or where you work. What tests do I need? Screening Your health care provider may recommend screening tests for certain conditions. This may include: Pelvic exam and Pap test. Lipid and cholesterol levels. Diabetes screening. This is done by checking your blood sugar (glucose) after you  have not eaten for a while (fasting). Hepatitis B test. Hepatitis C test. HIV (human immunodeficiency virus) test. STI (sexually transmitted infection) testing, if you are at risk. BRCA-related cancer screening. This may be done if you have a family history of breast, ovarian, tubal, or peritoneal cancers. Talk with your health care provider about your test results, treatment options, and if necessary, the need for more tests. Follow these instructions at home: Eating and drinking  Eat a healthy diet that includes fresh fruits and vegetables, whole grains, lean protein, and low-fat dairy products. Take vitamin and mineral supplements as recommended by your health care provider. Do not drink alcohol if: Your health care provider tells you not to drink. You are pregnant, may be pregnant, or are planning to become pregnant. If you drink alcohol: Limit how much you have to 0-1 drink a day. Know how much alcohol is in your drink. In the [place] one drink equals one 12 oz bottle of beer (355 mL), one 5 oz glass of wine (148 mL), or one 1 oz glass of hard liquor (44 mL). Lifestyle Brush your teeth every morning and night with fluoride toothpaste. Floss one time each day. Exercise for at least 30 minutes 5 or more days each week. Do not use any products that contain nicotine or tobacco. These products include cigarettes, chewing tobacco, and vaping devices, such as e-cigarettes. If you need help quitting, ask your health care provider. Do not use drugs. If you are sexually active, practice safe sex. Use a condom or other form of protection to prevent STIs. If you do not wish to become pregnant, use a form of birth control. If you plan to become pregnant, see your health care provider for a prepregnancy  visit. Find healthy ways to manage stress, such as: Meditation, yoga, or listening to music. Journaling. Talking to a trusted person. Spending time with friends and family. Minimize exposure to UV  radiation to reduce your risk of skin cancer. Safety Always wear your seat belt while driving or riding in a vehicle. Do not drive: If you have been drinking alcohol. Do not ride with someone who has been drinking. If you have been using any mind-altering substances or drugs. While texting. When you are tired or distracted. Wear a helmet and other protective equipment during sports activities. If you have firearms in your house, make sure you follow all gun safety procedures. Seek help if you have been physically or sexually abused. What's next? Go to your health care provider once a year for an annual wellness visit. Ask your health care provider how often you should have your eyes and teeth checked. Stay up to date on all vaccines. This information is not intended to replace advice given to you by your health care provider. Make sure you discuss any questions you have with your health care provider. Document Revised: 12/25/2020 Document Reviewed: 12/25/2020 Elsevier Patient Education  [place].

## 2022-03-13 DIAGNOSIS — Z419 Encounter for procedure for purposes other than remedying health state, unspecified: Secondary | ICD-10-CM | POA: Diagnosis not present

## 2022-04-12 DIAGNOSIS — Z419 Encounter for procedure for purposes other than remedying health state, unspecified: Secondary | ICD-10-CM | POA: Diagnosis not present

## 2022-05-13 DIAGNOSIS — Z419 Encounter for procedure for purposes other than remedying health state, unspecified: Secondary | ICD-10-CM | POA: Diagnosis not present

## 2022-05-28 ENCOUNTER — Encounter: Payer: Self-pay | Admitting: Physician Assistant

## 2022-05-28 ENCOUNTER — Ambulatory Visit: Payer: Medicaid Other | Admitting: Physician Assistant

## 2022-05-28 VITALS — BP 106/70 | HR 93 | Temp 97.4°F | Ht 66.0 in | Wt 178.6 lb

## 2022-05-28 DIAGNOSIS — G8929 Other chronic pain: Secondary | ICD-10-CM | POA: Insufficient documentation

## 2022-05-28 DIAGNOSIS — M5442 Lumbago with sciatica, left side: Secondary | ICD-10-CM | POA: Diagnosis not present

## 2022-05-28 DIAGNOSIS — F320 Major depressive disorder, single episode, mild: Secondary | ICD-10-CM

## 2022-05-28 NOTE — Progress Notes (Signed)
Subjective:  Patient ID: Elizabeth Singleton, female    DOB: [birth year]  Age: 31 y.o. MRN: [id number]  Chief Complaint  Patient presents with   Depression    HPI  Pt in for complaints of chronic back pain.  She states that she started having pain in lower back that radiated down left leg 3 years ago when she was pregnant.  Since that time she has had intermittent symptoms but has worsened in the past 6 months.  She is currently taking meloxicam and doing home exercises.  Would like to be referred to ortho for further evaluation  Pt with history of depression.  She is currently on zoloft 100mg  qd and would like to taper off medication.  Currently not having any symptoms and doing well Current Outpatient Medications on File Prior to Visit  Medication Sig Dispense Refill   meloxicam (MOBIC) 7.5 MG tablet Take 1 tablet (7.5 mg total) by mouth daily. 90 tablet 1   sertraline (ZOLOFT) 100 MG tablet Take 1 tablet (100 mg total) by mouth daily. 90 tablet 1   No current facility-administered medications on file prior to visit.   History reviewed. No pertinent past medical history. Past Surgical History:  Procedure Laterality Date   TONSILLECTOMY     TONSILLECTOMY      Family History  Problem Relation Age of Onset   Hypertension Father    Hypertension Paternal Grandmother    Diabetes Neg Hx    Cancer Neg Hx    Social History   Socioeconomic History   Marital status: Married    Spouse name: Not on file   Number of children: Not on file   Years of education: Not on file   Highest education level: Not on file  Occupational History   Not on file  Tobacco Use   Smoking status: Never   Smokeless tobacco: Never  Vaping Use   Vaping Use: Never used  Substance and Sexual Activity   Alcohol use: Never   Drug use: Never   Sexual activity: Yes    Birth control/protection: None  Other Topics Concern   Not on file  Social History Narrative   Not on file   Social Determinants of Health    Financial Resource Strain: Not on file  Food Insecurity: Not on file  Transportation Needs: Not on file  Physical Activity: Not on file  Stress: Not on file  Social Connections: Not on file    Review of Systems CONSTITUTIONAL: Negative for chills, fatigue, fever,   CARDIOVASCULAR: Negative for chest pain,  RESPIRATORY: Negative for recent cough and dyspnea.  GASTROINTESTINAL: Negative for abdominal pain, acid reflux symptoms, constipation, diarrhea, nausea and vomiting.  MSK: see HPI INTEGUMENTARY: Negative for rash.  PSYCHIATRIC: Negative for sleep disturbance and to question depression screen.  Negative for depression, negative for anhedonia.       Objective:  PHYSICAL EXAM:  .    05/28/2022    9:51 AM 03/12/2022    3:52 PM 11/25/2021   11:14 AM 08/15/2021    9:44 AM 07/22/2021   10:56 AM  Depression screen PHQ 2/9  Decreased Interest 0 0 0 0 0  Down, Depressed, Hopeless 0 0 0 0 0  PHQ - 2 Score 0 0 0 0 0  Altered sleeping 0 0 0  0  Tired, decreased energy 0 0 0  0  Change in appetite 0 0 0  0  Feeling bad or failure about yourself  0 0 0  0  Trouble concentrating 0 0 0  0  Moving slowly or fidgety/restless 0 0 0  0  Suicidal thoughts 0 0 0  0  PHQ-9 Score 0 0 0  0  Difficult doing work/chores  Not difficult at all Not difficult at all  Not difficult at all   VS: BP 106/70 (BP Location: Left Arm, Patient Position: Sitting, Cuff Size: Large)   Pulse 93   Temp (!) 97.4 F (36.3 C) (Temporal)   Ht 5\' 6"  (1.676 m)   Wt 178 lb 9.6 oz (81 kg)   SpO2 99%   BMI 28.83 kg/m   GEN: Well nourished, well developed, in no acute distress  Cardiac: RRR; no murmurs,  Respiratory:  normal respiratory rate and pattern with no distress - normal breath sounds with no rales, rhonchi, wheezes or rubs MS: no deformity or atrophy  Skin: warm and dry, no rash  Psych: euthymic mood, appropriate affect and demeanor  Lab Results  Component Value Date   WBC 19.4 (H) 01/20/2020   HGB  10.4 (L) 01/20/2020   HCT 32.1 (L) 01/20/2020   PLT 317 01/20/2020   TSH 1.100 06/20/2021      Assessment & Plan:   Problem List Items Addressed This Visit       Nervous and Auditory   Chronic left-sided low back pain with left-sided sciatica - Primary   Relevant Orders   Ambulatory referral to Orthopedic Surgery     Other   Depression, major, single episode, mild (HCC) Recommend to decrease zoloft to 50mg  for 2 weeks then every other day for 2 weeks then may stop medication  .  No orders of the defined types were placed in this encounter.   Orders Placed This Encounter  Procedures   Ambulatory referral to Orthopedic Surgery     Follow-up: Return if symptoms worsen or fail to improve.  An After Visit Summary was printed and given to the patient.  14/03/2021 [facility] [phone]

## 2022-06-03 ENCOUNTER — Other Ambulatory Visit: Payer: Self-pay | Admitting: Physician Assistant

## 2022-06-03 DIAGNOSIS — M79672 Pain in left foot: Secondary | ICD-10-CM

## 2022-06-12 DIAGNOSIS — Z419 Encounter for procedure for purposes other than remedying health state, unspecified: Secondary | ICD-10-CM | POA: Diagnosis not present

## 2022-06-29 ENCOUNTER — Encounter: Payer: Self-pay | Admitting: Physician Assistant

## 2022-06-29 ENCOUNTER — Ambulatory Visit: Payer: Medicaid Other | Admitting: Physician Assistant

## 2022-06-29 VITALS — BP 124/78 | HR 80 | Temp 97.7°F | Ht 66.0 in | Wt 184.0 lb

## 2022-06-29 DIAGNOSIS — Z3201 Encounter for pregnancy test, result positive: Secondary | ICD-10-CM | POA: Diagnosis not present

## 2022-06-29 DIAGNOSIS — N912 Amenorrhea, unspecified: Secondary | ICD-10-CM | POA: Diagnosis not present

## 2022-06-29 LAB — POCT URINE PREGNANCY: Preg Test, Ur: POSITIVE — AB

## 2022-06-29 NOTE — Progress Notes (Signed)
Subjective:  Patient ID: Elizabeth Singleton, female    DOB: Jan 02, 1991  Age: 31 y.o. MRN: [id number]  Chief Complaint  Patient presents with   Confirmation pregnancy    HPI  Pt states that her LMP was early November as far as she can remember - periods usually normal - she has had some breast tenderness and malaise and took a few home pregnancy tests that were positive Pt pregnancy history G2P1 Pt would like to see [facility] in [place] She has started prenatal vitamins with folic acid According to dates she is approximately [redacted] weeks gestation with estimated due date 02/20/23 Current Outpatient Medications on File Prior to Visit  Medication Sig Dispense Refill   meloxicam (MOBIC) 7.5 MG tablet TAKE 1 TABLET BY MOUTH EVERY DAY 90 tablet 1   sertraline (ZOLOFT) 100 MG tablet Take 1 tablet (100 mg total) by mouth daily. 90 tablet 1   No current facility-administered medications on file prior to visit.   History reviewed. No pertinent past medical history. Past Surgical History:  Procedure Laterality Date   TONSILLECTOMY     TONSILLECTOMY      Family History  Problem Relation Age of Onset   Hypertension Father    Hypertension Paternal Grandmother    Diabetes Neg Hx    Cancer Neg Hx    Social History   Socioeconomic History   Marital status: Married    Spouse name: Not on file   Number of children: Not on file   Years of education: Not on file   Highest education level: Not on file  Occupational History   Not on file  Tobacco Use   Smoking status: Never   Smokeless tobacco: Never  Vaping Use   Vaping Use: Never used  Substance and Sexual Activity   Alcohol use: Never   Drug use: Never   Sexual activity: Yes    Birth control/protection: None  Other Topics Concern   Not on file  Social History Narrative   Not on file   Social Determinants of Health   Financial Resource Strain: Not on file  Food Insecurity: Not on file  Transportation Needs: Not on file   Physical Activity: Not on file  Stress: Not on file  Social Connections: Not on file    Review of Systems CONSTITUTIONAL:see HPI CARDIOVASCULAR: Negative for chest pain,  RESPIRATORY: Negative for recent cough and dyspnea.  GASTROINTESTINAL: Negative for abdominal pain, acid reflux symptoms, constipation, diarrhea, nausea and vomiting.  GU - see HPI   Objective:  PHYSICAL EXAM:   VS: BP 124/78 (BP Location: Left Arm, Patient Position: Sitting)   Pulse 80   Temp 97.7 F (36.5 C) (Temporal)   Ht 5\' 6"  (1.676 m)   Wt 184 lb (83.5 kg)   SpO2 97%   BMI 29.70 kg/m   GEN: Well nourished, well developed, in no acute distress  Cardiac: RRR; no murmurs,  Respiratory:  normal respiratory rate and pattern with no distress - normal breath sounds with no rales, rhonchi, wheezes or rubs Skin: warm and dry, no rash  Psych: euthymic mood, appropriate affect and demeanor  Office Visit on 06/29/2022  Component Date Value Ref Range Status   Preg Test, Ur 06/29/2022 Positive (A)  Negative Final    Lab Results  Component Value Date   WBC 19.4 (H) 01/20/2020   HGB 10.4 (L) 01/20/2020   HCT 32.1 (L) 01/20/2020   PLT 317 01/20/2020   TSH 1.100 06/20/2021  Assessment & Plan:   Problem List Items Addressed This Visit   None Visit Diagnoses     Amenorrhea    -  Primary   Relevant Orders   POCT urine pregnancy (Completed)   Ambulatory referral to Obstetrics / Gynecology   Positive pregnancy test       Relevant Orders   Ambulatory referral to Obstetrics / Gynecology Continue prenatal vitamins     .  No orders of the defined types were placed in this encounter.   Orders Placed This Encounter  Procedures   Ambulatory referral to Obstetrics / Gynecology   POCT urine pregnancy     Follow-up: Return if symptoms worsen or fail to improve.  An After Visit Summary was printed and given to the patient.  Yetta Flock [facility] [phone]

## 2022-07-13 DIAGNOSIS — Z419 Encounter for procedure for purposes other than remedying health state, unspecified: Secondary | ICD-10-CM | POA: Diagnosis not present

## 2022-07-16 DIAGNOSIS — O3481 Maternal care for other abnormalities of pelvic organs, first trimester: Secondary | ICD-10-CM | POA: Diagnosis not present

## 2022-07-16 DIAGNOSIS — Z8759 Personal history of other complications of pregnancy, childbirth and the puerperium: Secondary | ICD-10-CM | POA: Diagnosis not present

## 2022-07-16 DIAGNOSIS — Z3689 Encounter for other specified antenatal screening: Secondary | ICD-10-CM | POA: Diagnosis not present

## 2022-07-16 DIAGNOSIS — Z3481 Encounter for supervision of other normal pregnancy, first trimester: Secondary | ICD-10-CM | POA: Diagnosis not present

## 2022-07-16 DIAGNOSIS — O3680X0 Pregnancy with inconclusive fetal viability, not applicable or unspecified: Secondary | ICD-10-CM | POA: Diagnosis not present

## 2022-07-16 DIAGNOSIS — Z8659 Personal history of other mental and behavioral disorders: Secondary | ICD-10-CM | POA: Diagnosis not present

## 2022-07-16 DIAGNOSIS — Z3A01 Less than 8 weeks gestation of pregnancy: Secondary | ICD-10-CM | POA: Diagnosis not present

## 2022-07-16 DIAGNOSIS — N83201 Unspecified ovarian cyst, right side: Secondary | ICD-10-CM | POA: Diagnosis not present

## 2022-08-13 DIAGNOSIS — Z3689 Encounter for other specified antenatal screening: Secondary | ICD-10-CM | POA: Diagnosis not present

## 2022-08-13 DIAGNOSIS — Z3481 Encounter for supervision of other normal pregnancy, first trimester: Secondary | ICD-10-CM | POA: Diagnosis not present

## 2022-08-13 DIAGNOSIS — Z419 Encounter for procedure for purposes other than remedying health state, unspecified: Secondary | ICD-10-CM | POA: Diagnosis not present

## 2022-09-10 DIAGNOSIS — Z3689 Encounter for other specified antenatal screening: Secondary | ICD-10-CM | POA: Diagnosis not present

## 2022-09-10 DIAGNOSIS — Z361 Encounter for antenatal screening for raised alphafetoprotein level: Secondary | ICD-10-CM | POA: Diagnosis not present

## 2022-09-11 DIAGNOSIS — Z419 Encounter for procedure for purposes other than remedying health state, unspecified: Secondary | ICD-10-CM | POA: Diagnosis not present

## 2022-10-12 DIAGNOSIS — Z419 Encounter for procedure for purposes other than remedying health state, unspecified: Secondary | ICD-10-CM | POA: Diagnosis not present

## 2022-10-15 DIAGNOSIS — Z3A20 20 weeks gestation of pregnancy: Secondary | ICD-10-CM | POA: Diagnosis not present

## 2022-10-15 DIAGNOSIS — Z363 Encounter for antenatal screening for malformations: Secondary | ICD-10-CM | POA: Diagnosis not present

## 2022-11-11 DIAGNOSIS — Z419 Encounter for procedure for purposes other than remedying health state, unspecified: Secondary | ICD-10-CM | POA: Diagnosis not present

## 2022-12-09 DIAGNOSIS — Z131 Encounter for screening for diabetes mellitus: Secondary | ICD-10-CM | POA: Diagnosis not present

## 2022-12-09 DIAGNOSIS — Z369 Encounter for antenatal screening, unspecified: Secondary | ICD-10-CM | POA: Diagnosis not present

## 2022-12-09 DIAGNOSIS — Z23 Encounter for immunization: Secondary | ICD-10-CM | POA: Diagnosis not present

## 2022-12-09 DIAGNOSIS — Z6791 Unspecified blood type, Rh negative: Secondary | ICD-10-CM | POA: Diagnosis not present

## 2022-12-12 DIAGNOSIS — Z419 Encounter for procedure for purposes other than remedying health state, unspecified: Secondary | ICD-10-CM | POA: Diagnosis not present

## 2022-12-14 DIAGNOSIS — R7302 Impaired glucose tolerance (oral): Secondary | ICD-10-CM | POA: Diagnosis not present

## 2023-01-07 DIAGNOSIS — O4393 Unspecified placental disorder, third trimester: Secondary | ICD-10-CM | POA: Diagnosis not present

## 2023-01-07 DIAGNOSIS — Z3689 Encounter for other specified antenatal screening: Secondary | ICD-10-CM | POA: Diagnosis not present

## 2023-01-07 DIAGNOSIS — Z3A32 32 weeks gestation of pregnancy: Secondary | ICD-10-CM | POA: Diagnosis not present

## 2023-01-11 DIAGNOSIS — Z419 Encounter for procedure for purposes other than remedying health state, unspecified: Secondary | ICD-10-CM | POA: Diagnosis not present

## 2023-02-05 DIAGNOSIS — Z369 Encounter for antenatal screening, unspecified: Secondary | ICD-10-CM | POA: Diagnosis not present

## 2023-02-11 DIAGNOSIS — Z419 Encounter for procedure for purposes other than remedying health state, unspecified: Secondary | ICD-10-CM | POA: Diagnosis not present

## 2023-02-12 DIAGNOSIS — Z8751 Personal history of pre-term labor: Secondary | ICD-10-CM | POA: Diagnosis not present

## 2023-02-12 DIAGNOSIS — Z3493 Encounter for supervision of normal pregnancy, unspecified, third trimester: Secondary | ICD-10-CM | POA: Diagnosis not present

## 2023-02-12 DIAGNOSIS — Z79899 Other long term (current) drug therapy: Secondary | ICD-10-CM | POA: Diagnosis not present

## 2023-02-12 DIAGNOSIS — Z3A37 37 weeks gestation of pregnancy: Secondary | ICD-10-CM | POA: Diagnosis not present

## 2023-03-14 DIAGNOSIS — Z419 Encounter for procedure for purposes other than remedying health state, unspecified: Secondary | ICD-10-CM | POA: Diagnosis not present

## 2023-03-24 DIAGNOSIS — Z124 Encounter for screening for malignant neoplasm of cervix: Secondary | ICD-10-CM | POA: Diagnosis not present

## 2023-03-31 ENCOUNTER — Telehealth: Payer: Self-pay

## 2023-03-31 NOTE — Telephone Encounter (Signed)
LVM for patient to call back [phone], or to call PCP office to schedule follow up apt. AS, CMA  

## 2023-04-13 DIAGNOSIS — Z419 Encounter for procedure for purposes other than remedying health state, unspecified: Secondary | ICD-10-CM | POA: Diagnosis not present

## 2023-04-15 ENCOUNTER — Encounter: Payer: Self-pay | Admitting: Physician Assistant

## 2023-04-15 ENCOUNTER — Ambulatory Visit (INDEPENDENT_AMBULATORY_CARE_PROVIDER_SITE_OTHER): Payer: Medicaid Other | Admitting: Physician Assistant

## 2023-04-15 VITALS — BP 114/72 | HR 90 | Temp 97.3°F | Ht 65.5 in | Wt 208.6 lb

## 2023-04-15 DIAGNOSIS — D649 Anemia, unspecified: Secondary | ICD-10-CM | POA: Insufficient documentation

## 2023-04-15 DIAGNOSIS — D508 Other iron deficiency anemias: Secondary | ICD-10-CM

## 2023-04-15 DIAGNOSIS — M25532 Pain in left wrist: Secondary | ICD-10-CM | POA: Diagnosis not present

## 2023-04-15 DIAGNOSIS — Z0000 Encounter for general adult medical examination without abnormal findings: Secondary | ICD-10-CM

## 2023-04-15 NOTE — Progress Notes (Signed)
Subjective:  Patient ID: Elizabeth Singleton, female    DOB: 04/08/1991  Age: 32 y.o. MRN: [id number]  Chief Complaint  Patient presents with   Annual Exam    HPI Well Adult Physical: Patient here for a comprehensive physical exam.The patient reports  she has had some intermittent left wrist pain with no injury for the past month --- also had iron def with pregnancy - delivered 2 months ago Do you take any herbs or supplements that were not prescribed by a doctor? no Are you taking calcium supplements? no Are you taking aspirin daily? no  Encounter for general adult medical examination without abnormal findings  Physical ("At Risk" items are starred): Patient's last physical exam was 1 year ago .  Patient is not afflicted from Stress Incontinence and Urge Incontinence  Patient wears a seat belts Patient has smoke detectors and has carbon monoxide detectors. Patient practices appropriate gun safety. Patient wears sunscreen with extended sun exposure. Dental Care: brushes and flosses daily. Last dental visit: up to date Vision impairments: none Ophthalmology/Optometry: Annual visit.  Hearing loss: none  LMP - currently breastfeeding - no cycle Pregnancy history: G1P1 Safe at home: Yes Self breast exams: Yes Last pap: last month      04/15/2023   10:56 AM 05/28/2022    9:51 AM 03/12/2022    3:52 PM 11/25/2021   11:14 AM 08/15/2021    9:44 AM  Depression screen PHQ 2/9  Decreased Interest 0 0 0 0 0  Down, Depressed, Hopeless 0 0 0 0 0  PHQ - 2 Score 0 0 0 0 0  Altered sleeping 0 0 0 0   Tired, decreased energy 3 0 0 0   Change in appetite 0 0 0 0   Feeling bad or failure about yourself  1 0 0 0   Trouble concentrating 0 0 0 0   Moving slowly or fidgety/restless 0 0 0 0   Suicidal thoughts 0 0 0 0   PHQ-9 Score 4 0 0 0   Difficult doing work/chores Not difficult at all  Not difficult at all Not difficult at all          01/22/2020    8:08 AM 08/15/2021    9:44 AM 03/12/2022     3:51 PM 05/28/2022    9:53 AM 04/15/2023   10:56 AM  Fall Risk  Falls in the past year?  0 0 0 0  Was there an injury with Fall?  0 0 0 0  Fall Risk Category Calculator  0 0 0 0  Fall Risk Category (Retired)  Low Low Low   (RETIRED) Patient Fall Risk Level Low fall risk Low fall risk Low fall risk Low fall risk   Patient at Risk for Falls Due to   No Fall Risks No Fall Risks No Fall Risks  Fall risk Follow up   Falls evaluation completed Falls evaluation completed Falls evaluation completed             Social Hx   Social History   Socioeconomic History   Marital status: Married    Spouse name: Not on file   Number of children: Not on file   Years of education: Not on file   Highest education level: Not on file  Occupational History   Not on file  Tobacco Use   Smoking status: Never   Smokeless tobacco: Never  Vaping Use   Vaping status: Never Used  Substance and Sexual Activity  Alcohol use: Never   Drug use: Never   Sexual activity: Yes    Birth control/protection: None  Other Topics Concern   Not on file  Social History Narrative   Not on file   Social Determinants of Health   Financial Resource Strain: Low Risk  (04/15/2023)   Overall Financial Resource Strain (CARDIA)    Difficulty of Paying Living Expenses: Not hard at all  Food Insecurity: No Food Insecurity (04/15/2023)   Hunger Vital Sign    Worried About Running Out of Food in the Last Year: Never true    Ran Out of Food in the Last Year: Never true  Transportation Needs: No Transportation Needs (04/15/2023)   PRAPARE - Administrator, Civil Service (Medical): No    Lack of Transportation (Non-Medical): No  Physical Activity: Insufficiently Active (04/15/2023)   Exercise Vital Sign    Days of Exercise per Week: 3 days    Minutes of Exercise per Session: 30 min  Stress: No Stress Concern Present (04/15/2023)   [employer] of Occupational Health - Occupational Stress Questionnaire     Feeling of Stress : Not at all  Social Connections: Moderately Integrated (04/15/2023)   Social Connection and Isolation Panel [NHANES]    Frequency of Communication with Friends and Family: More than three times a week    Frequency of Social Gatherings with Friends and Family: Three times a week    Attends Religious Services: More than 4 times per year    Active Member of Clubs or Organizations: No    Attends Banker Meetings: Never    Marital Status: Married   History reviewed. No pertinent past medical history. Past Surgical History:  Procedure Laterality Date   TONSILLECTOMY     TONSILLECTOMY      Family History  Problem Relation Age of Onset   Hypertension Father    Hypertension Paternal Grandmother    Diabetes Neg Hx    Cancer Neg Hx     ROS CONSTITUTIONAL: Negative for chills, fatigue, fever, unintentional weight gain and unintentional weight loss.  E/N/T: Negative for ear pain, nasal congestion and sore throat.  CARDIOVASCULAR: Negative for chest pain, dizziness, palpitations and pedal edema.  RESPIRATORY: Negative for recent cough and dyspnea.  GASTROINTESTINAL: Negative for abdominal pain, acid reflux symptoms, constipation, diarrhea, nausea and vomiting.  MSK:see HPI INTEGUMENTARY: Negative for rash.  NEUROLOGICAL: Negative for dizziness and headaches.  PSYCHIATRIC: Negative for sleep disturbance and to question depression screen.  Negative for depression, negative for anhedonia.   Objective:  PHYSICAL EXAM:   BP 114/72 (BP Location: Left Arm, Patient Position: Sitting, Cuff Size: Large)   Pulse 90   Temp (!) 97.3 F (36.3 C) (Temporal)   Ht 5' 5.5" (1.664 m)   Wt 208 lb 9.6 oz (94.6 kg)   LMP  (LMP Unknown)   SpO2 98%   Breastfeeding Yes   BMI 34.18 kg/m   Vision Screening   Right eye Left eye Both eyes  Without correction 20/20 20/20 20/20   With correction       GEN: Well nourished, well developed, in no acute distress  HEENT: normal  external ears and nose - normal external auditory canals and TMS - hearing grossly normal - normal nasal mucosa and septum - Lips, Teeth and Gums - normal  Oropharynx - normal mucosa, palate, and posterior pharynx Neck: no JVD or masses - no thyromegaly Cardiac: RRR; no murmurs, rubs, or gallops,no edema - no significant varicosities Respiratory:  normal respiratory rate and pattern with no distress - normal breath sounds with no rales, rhonchi, wheezes or rubs GI: normal bowel sounds, no masses or tenderness MS: no deformity or atrophy  Skin: warm and dry, no rash  Neuro:  Alert and Oriented x 3, Strength and sensation are intact - CN II-Xii grossly intact Psych: euthymic mood, appropriate affect and demeanor  Assessment & Plan:  Routine physical examination -     CBC with Differential/Platelet -     Comprehensive metabolic panel -     TSH -     Lipid panel -     Iron, TIBC and Ferritin Panel  Other iron deficiency anemia -     Iron, TIBC and Ferritin Panel  Left wrist pain Rom exercises   This is a list of the screening recommended for you and due dates:  Health Maintenance  Topic Date Due   Flu Shot  10/11/2023*   Pap with HPV screening  03/23/2026   DTaP/Tdap/Td vaccine (2 - Td or Tdap) 11/29/2029   HIV Screening  Completed   HPV Vaccine  Aged Out   COVID-19 Vaccine  Discontinued  *Topic was postponed. The date shown is not the original due date.     Follow-up: Return in about 1 year (around 04/14/2024) for fasting physical - 20 min.  An After Visit Summary was printed and given to the patient.  Jettie Pagan [facility] [phone]

## 2023-04-16 ENCOUNTER — Other Ambulatory Visit: Payer: Self-pay | Admitting: Physician Assistant

## 2023-04-16 DIAGNOSIS — R899 Unspecified abnormal finding in specimens from other organs, systems and tissues: Secondary | ICD-10-CM

## 2023-04-16 LAB — LIPID PANEL
Chol/HDL Ratio: 4 {ratio} (ref 0.0–4.4)
Cholesterol, Total: 171 mg/dL (ref 100–199)
HDL: 43 mg/dL (ref >39)
LDL Chol Calc (NIH): 92 mg/dL (ref 0–99)
Triglycerides: 215 mg/dL — ABNORMAL HIGH (ref 0–149)
VLDL Cholesterol Cal: 36 mg/dL (ref 5–40)

## 2023-04-16 LAB — COMPREHENSIVE METABOLIC PANEL
ALT: 39 [IU]/L — ABNORMAL HIGH (ref 0–32)
AST: 29 [IU]/L (ref 0–40)
Albumin: 4.2 g/dL (ref 3.9–4.9)
Alkaline Phosphatase: 99 [IU]/L (ref 44–121)
BUN/Creatinine Ratio: 17 (ref 9–23)
BUN: 9 mg/dL (ref 6–20)
Bilirubin Total: <0.2 mg/dL (ref 0.0–1.2)
CO2: 26 mmol/L (ref 20–29)
Calcium: 9.9 mg/dL (ref 8.7–10.2)
Chloride: 102 mmol/L (ref 96–106)
Creatinine, Ser: 0.52 mg/dL — ABNORMAL LOW (ref 0.57–1.00)
Globulin, Total: 2.8 g/dL (ref 1.5–4.5)
Glucose: 94 mg/dL (ref 70–99)
Potassium: 4.9 mmol/L (ref 3.5–5.2)
Sodium: 140 mmol/L (ref 134–144)
Total Protein: 7 g/dL (ref 6.0–8.5)
eGFR: 126 mL/min/{1.73_m2} (ref >59)

## 2023-04-16 LAB — CBC WITH DIFFERENTIAL/PLATELET
Basophils Absolute: 0.1 10*3/uL (ref 0.0–0.2)
Basos: 1 %
EOS (ABSOLUTE): 0.1 10*3/uL (ref 0.0–0.4)
Eos: 2 %
Hematocrit: 41.6 % (ref 34.0–46.6)
Hemoglobin: 12.9 g/dL (ref 11.1–15.9)
Immature Grans (Abs): 0 10*3/uL (ref 0.0–0.1)
Immature Granulocytes: 0 %
Lymphocytes Absolute: 1.5 10*3/uL (ref 0.7–3.1)
Lymphs: 20 %
MCH: 25.3 pg — ABNORMAL LOW (ref 26.6–33.0)
MCHC: 31 g/dL — ABNORMAL LOW (ref 31.5–35.7)
MCV: 82 fL (ref 79–97)
Monocytes Absolute: 0.7 10*3/uL (ref 0.1–0.9)
Monocytes: 9 %
Neutrophils Absolute: 5.1 10*3/uL (ref 1.4–7.0)
Neutrophils: 68 %
Platelets: 371 10*3/uL (ref 150–450)
RBC: 5.09 x10E6/uL (ref 3.77–5.28)
RDW: 17.4 % — ABNORMAL HIGH (ref 11.7–15.4)
WBC: 7.5 10*3/uL (ref 3.4–10.8)

## 2023-04-16 LAB — IRON,TIBC AND FERRITIN PANEL
Ferritin: 39 ng/mL (ref 15–150)
Iron Saturation: 22 % (ref 15–55)
Iron: 72 ug/dL (ref 27–159)
Total Iron Binding Capacity: 329 ug/dL (ref 250–450)
UIBC: 257 ug/dL (ref 131–425)

## 2023-04-16 LAB — TSH: TSH: 0.008 u[IU]/mL — ABNORMAL LOW (ref 0.450–4.500)

## 2023-04-21 DIAGNOSIS — Z30017 Encounter for initial prescription of implantable subdermal contraceptive: Secondary | ICD-10-CM | POA: Diagnosis not present

## 2023-05-14 DIAGNOSIS — Z419 Encounter for procedure for purposes other than remedying health state, unspecified: Secondary | ICD-10-CM | POA: Diagnosis not present

## 2023-06-13 DIAGNOSIS — Z419 Encounter for procedure for purposes other than remedying health state, unspecified: Secondary | ICD-10-CM | POA: Diagnosis not present

## 2023-06-29 ENCOUNTER — Ambulatory Visit: Payer: Medicaid Other | Admitting: Physician Assistant

## 2023-06-29 ENCOUNTER — Encounter: Payer: Self-pay | Admitting: Physician Assistant

## 2023-06-29 VITALS — BP 120/90 | HR 96 | Temp 97.6°F | Resp 12 | Ht 65.5 in | Wt 204.0 lb

## 2023-06-29 DIAGNOSIS — S63502A Unspecified sprain of left wrist, initial encounter: Secondary | ICD-10-CM

## 2023-06-29 DIAGNOSIS — M25532 Pain in left wrist: Secondary | ICD-10-CM | POA: Diagnosis not present

## 2023-06-29 MED ORDER — MELOXICAM 7.5 MG PO TABS
7.5000 mg | ORAL_TABLET | Freq: Every day | ORAL | 0 refills | Status: AC
Start: 2023-06-29 — End: ?

## 2023-06-29 NOTE — Progress Notes (Signed)
   Acute Office Visit  Subjective:    Patient ID: Elizabeth Singleton, female    DOB: Feb 01, 1991, 32 y.o.   MRN: [id number]  Chief Complaint  Patient presents with   Wrist Pain    Left    HPI: Patient is in today for complaints of left wrist pain.  She states she has had issues for about 3 months with tenderness and popping in her wrist.  She has tried tylenol and ibuprofen but not taking regularly Denies any injury    Current Outpatient Medications:    Ferrous Sulfate (IRON PO), Take 1 tablet by mouth daily., Disp: , Rfl:    meloxicam (MOBIC) 7.5 MG tablet, Take 1 tablet (7.5 mg total) by mouth daily., Disp: 30 tablet, Rfl: 0  No Known Allergies  ROS CONSTITUTIONAL: Negative for chills, fatigue, fever,   MSK: see HPI INTEGUMENTARY: Negative for rash.   Objective:    PHYSICAL EXAM:   BP (!) 120/90 (Cuff Size: Normal)   Pulse 96   Temp 97.6 F (36.4 C)   Resp 12   Ht 5' 5.5" (1.664 m)   Wt 204 lb (92.5 kg)   LMP 06/08/2023 (Approximate)   SpO2 99%   Breastfeeding No   BMI 33.43 kg/m    GEN: Well nourished, well developed, in no acute distress  Cardiac: RRR; no murmurs, Respiratory:  normal respiratory rate and pattern with no distress - normal breath sounds with no rales, rhonchi, wheezes or rubs MS: no deformity or atrophy - left wrist tender to palpation - no swelling noted Skin: warm and dry, no rash      Assessment & Plan:    Sprain of left wrist, initial encounter -     Meloxicam; Take 1 tablet (7.5 mg total) by mouth daily.  Dispense: 30 tablet; Refill: 0 -     DG Wrist Complete Left; Future Recommend wrist splint    Follow-up: Return if symptoms worsen or fail to improve.  An After Visit Summary was printed and given to the patient.  Jettie Pagan [facility] [phone]

## 2023-07-14 DIAGNOSIS — Z419 Encounter for procedure for purposes other than remedying health state, unspecified: Secondary | ICD-10-CM | POA: Diagnosis not present

## 2023-08-14 DIAGNOSIS — Z419 Encounter for procedure for purposes other than remedying health state, unspecified: Secondary | ICD-10-CM | POA: Diagnosis not present

## 2023-09-11 DIAGNOSIS — Z419 Encounter for procedure for purposes other than remedying health state, unspecified: Secondary | ICD-10-CM | POA: Diagnosis not present

## 2023-10-23 DIAGNOSIS — Z419 Encounter for procedure for purposes other than remedying health state, unspecified: Secondary | ICD-10-CM | POA: Diagnosis not present

## 2023-11-22 DIAGNOSIS — Z419 Encounter for procedure for purposes other than remedying health state, unspecified: Secondary | ICD-10-CM | POA: Diagnosis not present

## 2023-12-23 DIAGNOSIS — Z419 Encounter for procedure for purposes other than remedying health state, unspecified: Secondary | ICD-10-CM | POA: Diagnosis not present

## 2024-01-22 DIAGNOSIS — Z419 Encounter for procedure for purposes other than remedying health state, unspecified: Secondary | ICD-10-CM | POA: Diagnosis not present

## 2024-02-22 DIAGNOSIS — Z419 Encounter for procedure for purposes other than remedying health state, unspecified: Secondary | ICD-10-CM | POA: Diagnosis not present

## 2024-03-24 DIAGNOSIS — Z419 Encounter for procedure for purposes other than remedying health state, unspecified: Secondary | ICD-10-CM | POA: Diagnosis not present

## 2024-04-17 ENCOUNTER — Ambulatory Visit (INDEPENDENT_AMBULATORY_CARE_PROVIDER_SITE_OTHER): Payer: Medicaid Other | Admitting: Physician Assistant

## 2024-04-17 ENCOUNTER — Encounter: Payer: Self-pay | Admitting: Physician Assistant

## 2024-04-17 VITALS — BP 122/80 | HR 78 | Temp 97.8°F | Resp 18 | Ht 64.75 in | Wt 168.8 lb

## 2024-04-17 DIAGNOSIS — D508 Other iron deficiency anemias: Secondary | ICD-10-CM | POA: Diagnosis not present

## 2024-04-17 DIAGNOSIS — Z0000 Encounter for general adult medical examination without abnormal findings: Secondary | ICD-10-CM

## 2024-04-17 DIAGNOSIS — R899 Unspecified abnormal finding in specimens from other organs, systems and tissues: Secondary | ICD-10-CM | POA: Diagnosis not present

## 2024-04-17 LAB — POCT URINALYSIS DIP (CLINITEK)
Bilirubin, UA: NEGATIVE
Glucose, UA: NEGATIVE mg/dL
Ketones, POC UA: NEGATIVE mg/dL
Nitrite, UA: NEGATIVE
Spec Grav, UA: 1.01 (ref 1.010–1.025)
Urobilinogen, UA: 0.2 U/dL
pH, UA: 8 (ref 5.0–8.0)

## 2024-04-17 NOTE — Progress Notes (Signed)
 Subjective:  Patient ID: Elizabeth Singleton, female    DOB: 1991/01/17  Age: 33 y.o. MRN: [id number]  Chief Complaint  Patient presents with   Annual Exam    HPI Well Adult Physical: Patient here for a comprehensive physical exam.The patient reports no problems - however with last labwork a thyroid  panel was supposed to have been done but [facility] did not complete - her TSH was low - pt has some intermittent fatigue but no other symptoms (palpitations, etc) Do you take any herbs or supplements that were not prescribed by a doctor? Is taking iron - had low iron with pregnancy last year and has continued supplement Are you taking calcium  supplements? no Are you taking aspirin daily? no  Encounter for general adult medical examination without abnormal findings  Physical (At Risk items are starred): Patient's last physical exam was 1 year ago .  Patient is not afflicted from Stress Incontinence and Urge Incontinence  Patient wears a seat belts Patient has smoke detectors and has carbon monoxide detectors. Patient practices appropriate gun safety. Patient wears sunscreen with extended sun exposure. Dental Care: brushes and flosses daily. Last dental visit: is due for visit Vision impairments: wears glasses Ophthalmology/Optometry: is due for visit Hearing loss: none  Patient's last menstrual period was 04/15/2024 (exact date). Pregnancy history: G2P2 Safe at home: Yes Self breast exams: Yes Last pap: up to date      04/17/2024    9:49 AM 04/15/2023   10:56 AM 05/28/2022    9:51 AM 03/12/2022    3:52 PM 11/25/2021   11:14 AM  Depression screen PHQ 2/9  Decreased Interest 0 0 0 0 0  Down, Depressed, Hopeless 0 0 0 0 0  PHQ - 2 Score 0 0 0 0 0  Altered sleeping  0 0 0 0  Tired, decreased energy  3 0 0 0  Change in appetite  0 0 0 0  Feeling bad or failure about yourself   1 0 0 0  Trouble concentrating  0 0 0 0  Moving slowly or fidgety/restless  0 0 0 0  Suicidal thoughts  0 0 0 0   PHQ-9 Score  4 0 0 0  Difficult doing work/chores  Not difficult at all  Not difficult at all Not difficult at all         08/15/2021    9:44 AM 03/12/2022    3:51 PM 05/28/2022    9:53 AM 04/15/2023   10:56 AM 04/17/2024    9:48 AM  Fall Risk  Falls in the past year? 0 0 0 0 0  Was there an injury with Fall? 0 0 0 0 0  Fall Risk Category Calculator 0 0 0 0 0  Fall Risk Category (Retired) Low  Low  Low     (RETIRED) Patient Fall Risk Level Low fall risk  Low fall risk  Low fall risk     Patient at Risk for Falls Due to  No Fall Risks No Fall Risks No Fall Risks No Fall Risks  Fall risk Follow up  Falls evaluation completed  Falls evaluation completed  Falls evaluation completed Falls evaluation completed     Data saved with a previous flowsheet row definition             Social Hx   Social History   Socioeconomic History   Marital status: Married    Spouse name: Not on file   Number of children: Not on file  Years of education: Not on file   Highest education level: Some college, no degree  Occupational History   Not on file  Tobacco Use   Smoking status: Never   Smokeless tobacco: Never  Vaping Use   Vaping status: Never Used  Substance and Sexual Activity   Alcohol use: Never   Drug use: Never   Sexual activity: Yes    Birth control/protection: None  Other Topics Concern   Not on file  Social History Narrative   Not on file   Social Drivers of Health   Financial Resource Strain: Low Risk  (04/17/2024)   Overall Financial Resource Strain (CARDIA)    Difficulty of Paying Living Expenses: Not hard at all  Food Insecurity: No Food Insecurity (04/17/2024)   Hunger Vital Sign    Worried About Running Out of Food in the Last Year: Never true    Ran Out of Food in the Last Year: Never true  Transportation Needs: No Transportation Needs (04/17/2024)   PRAPARE - Administrator, Civil Service (Medical): No    Lack of Transportation (Non-Medical): No   Physical Activity: Insufficiently Active (04/17/2024)   Exercise Vital Sign    Days of Exercise per Week: 2 days    Minutes of Exercise per Session: 20 min  Stress: No Stress Concern Present (04/17/2024)   [employer] of Occupational Health - Occupational Stress Questionnaire    Feeling of Stress: Only a little  Social Connections: Moderately Integrated (04/17/2024)   Social Connection and Isolation Panel    Frequency of Communication with Friends and Family: Three times a week    Frequency of Social Gatherings with Friends and Family: Once a week    Attends Religious Services: 1 to 4 times per year    Active Member of [employer] or Organizations: No    Attends Banker Meetings: Never    Marital Status: Married   History reviewed. No pertinent past medical history. Past Surgical History:  Procedure Laterality Date   TONSILLECTOMY     TONSILLECTOMY      Family History  Problem Relation Age of Onset   Hypertension Father    Hypertension Paternal Grandmother    Diabetes Neg Hx    Cancer Neg Hx     ROS CONSTITUTIONAL: see HPI E/N/T: Negative for ear pain, nasal congestion and sore throat.  CARDIOVASCULAR: Negative for chest pain, dizziness, palpitations and pedal edema.  RESPIRATORY: Negative for recent cough and dyspnea.  GASTROINTESTINAL: Negative for abdominal pain, acid reflux symptoms, constipation, diarrhea, nausea and vomiting.  MSK: Negative for arthralgias and myalgias.  INTEGUMENTARY: Negative for rash.  NEUROLOGICAL: Negative for dizziness and headaches.  PSYCHIATRIC: Negative for sleep disturbance and to question depression screen.  Negative for depression, negative for anhedonia.   Objective:  PHYSICAL EXAM:   BP 122/80   Pulse 78   Temp 97.8 F (36.6 C) (Temporal)   Resp 18   Ht 5' 4.75 (1.645 m)   Wt 168 lb 12.8 oz (76.6 kg)   LMP 04/15/2024 (Exact Date)   SpO2 100%   BMI 28.31 kg/m   Vision Screening   Right eye Left eye Both eyes   Without correction 20/20 20/20 20/20   With correction       GEN: Well nourished, well developed, in no acute distress  HEENT: normal external ears and nose - normal external auditory canals and TMS - hearing grossly normal - - Lips, Teeth and Gums - normal  Oropharynx - normal mucosa,  palate, and posterior pharynx Neck: no JVD or masses - no thyromegaly Cardiac: RRR; no murmurs, rubs, or gallops,no edema - no significant varicosities Respiratory:  normal respiratory rate and pattern with no distress - normal breath sounds with no rales, rhonchi, wheezes or rubs GI: normal bowel sounds, no masses or tenderness MS: no deformity or atrophy  Skin: warm and dry, no rash  Neuro:  Alert and Oriented x 3, Strength and sensation are intact - CN II-Xii grossly intact Psych: euthymic mood, appropriate affect and demeanor Office Visit on 04/17/2024  Component Date Value Ref Range Status   Color, UA 04/17/2024 red (A)  yellow Final   Clarity, UA 04/17/2024 cloudy (A)  clear Final   Glucose, UA 04/17/2024 negative  negative mg/dL Final   Bilirubin, UA 89/93/7974 negative  negative Final   Ketones, POC UA 04/17/2024 negative  negative mg/dL Final   Spec Grav, UA 89/93/7974 1.010  1.010 - 1.025 Final   Blood, UA 04/17/2024 large (A)  negative Final   pH, UA 04/17/2024 8.0  5.0 - 8.0 Final   POC PROTEIN,UA 04/17/2024 trace  negative, trace Final   Urobilinogen, UA 04/17/2024 0.2  0.2 or 1.0 E.U./dL Final   Nitrite, UA 89/93/7974 Negative  Negative Final   Leukocytes, UA 04/17/2024 Trace (A)  Negative Final    Assessment & Plan:  Annual physical exam -     Comprehensive metabolic panel with GFR -     Thyroid  Panel With TSH -     Lipid panel -     POCT URINALYSIS DIP (CLINITEK) -     Fe+CBC/D/Plt+TIBC+Fer+Retic  Abnormal laboratory test -     Thyroid  Panel With TSH  Other iron deficiency anemia -     Fe+CBC/D/Plt+TIBC+Fer+Retic    This is a list of the screening recommended for you and  due dates:  Health Maintenance  Topic Date Due   Flu Shot  10/10/2024*   Pap with HPV screening  03/23/2028   DTaP/Tdap/Td vaccine (2 - Td or Tdap) 11/29/2029   HIV Screening  Completed   Pneumococcal Vaccine  Aged Out   Meningitis B Vaccine  Aged Out   Hepatitis B Vaccine  Discontinued   HPV Vaccine  Discontinued   COVID-19 Vaccine  Discontinued  *Topic was postponed. The date shown is not the original due date.     Follow-up: Return in about 1 year (around 04/17/2025) for fasting physical.  An After Visit Summary was printed and given to the patient.  CAMIE JONELLE NICHOLAUS DEVONNA [facility] [phone]

## 2024-04-18 ENCOUNTER — Ambulatory Visit: Payer: Self-pay | Admitting: Physician Assistant

## 2024-04-18 LAB — LIPID PANEL
Chol/HDL Ratio: 4.1 ratio (ref 0.0–4.4)
Cholesterol, Total: 175 mg/dL (ref 100–199)
HDL: 43 mg/dL (ref >39)
LDL Chol Calc (NIH): 110 mg/dL — ABNORMAL HIGH (ref 0–99)
Triglycerides: 121 mg/dL (ref 0–149)
VLDL Cholesterol Cal: 22 mg/dL (ref 5–40)

## 2024-04-18 LAB — FE+CBC/D/PLT+TIBC+FER+RETIC
Basophils Absolute: 0.1 x10E3/uL (ref 0.0–0.2)
Basos: 1 %
EOS (ABSOLUTE): 0.2 x10E3/uL (ref 0.0–0.4)
Eos: 3 %
Ferritin: 48 ng/mL (ref 15–150)
Hematocrit: 43.9 % (ref 34.0–46.6)
Hemoglobin: 14.5 g/dL (ref 11.1–15.9)
Immature Grans (Abs): 0 x10E3/uL (ref 0.0–0.1)
Immature Granulocytes: 0 %
Iron Saturation: 29 % (ref 15–55)
Iron: 102 ug/dL (ref 27–159)
Lymphocytes Absolute: 2.1 x10E3/uL (ref 0.7–3.1)
Lymphs: 29 %
MCH: 30 pg (ref 26.6–33.0)
MCHC: 33 g/dL (ref 31.5–35.7)
MCV: 91 fL (ref 79–97)
Monocytes Absolute: 0.5 x10E3/uL (ref 0.1–0.9)
Monocytes: 7 %
Neutrophils Absolute: 4.2 x10E3/uL (ref 1.4–7.0)
Neutrophils: 60 %
Platelets: 336 x10E3/uL (ref 150–450)
RBC: 4.84 x10E6/uL (ref 3.77–5.28)
RDW: 13 % (ref 11.7–15.4)
Retic Ct Pct: 1 % (ref 0.6–2.6)
Total Iron Binding Capacity: 352 ug/dL (ref 250–450)
UIBC: 250 ug/dL (ref 131–425)
WBC: 7.1 x10E3/uL (ref 3.4–10.8)

## 2024-04-18 LAB — COMPREHENSIVE METABOLIC PANEL WITH GFR
ALT: 19 IU/L (ref 0–32)
AST: 19 IU/L (ref 0–40)
Albumin: 4.7 g/dL (ref 3.9–4.9)
Alkaline Phosphatase: 76 IU/L (ref 41–116)
BUN/Creatinine Ratio: 14 (ref 9–23)
BUN: 11 mg/dL (ref 6–20)
Bilirubin Total: 0.2 mg/dL (ref 0.0–1.2)
CO2: 21 mmol/L (ref 20–29)
Calcium: 9.6 mg/dL (ref 8.7–10.2)
Chloride: 102 mmol/L (ref 96–106)
Creatinine, Ser: 0.81 mg/dL (ref 0.57–1.00)
Globulin, Total: 2.2 g/dL (ref 1.5–4.5)
Glucose: 84 mg/dL (ref 70–99)
Potassium: 4.9 mmol/L (ref 3.5–5.2)
Sodium: 138 mmol/L (ref 134–144)
Total Protein: 6.9 g/dL (ref 6.0–8.5)
eGFR: 98 mL/min/1.73 (ref >59)

## 2024-04-18 LAB — THYROID PANEL WITH TSH
Free Thyroxine Index: 2 (ref 1.2–4.9)
T3 Uptake Ratio: 29 % (ref 24–39)
T4, Total: 7 ug/dL (ref 4.5–12.0)
TSH: 2.16 u[IU]/mL (ref 0.450–4.500)

## 2025-04-19 ENCOUNTER — Encounter: Admitting: Physician Assistant
